# Patient Record
Sex: Male | Born: 1955 | Race: White | Hispanic: No | Marital: Married | Smoking: Former smoker
Health system: Southern US, Community
[De-identification: ages and names within clinical notes are randomized; demographics above are authoritative.]

## PROBLEM LIST (undated history)

## (undated) DIAGNOSIS — I10 Essential (primary) hypertension: Secondary | ICD-10-CM

## (undated) DIAGNOSIS — F419 Anxiety disorder, unspecified: Secondary | ICD-10-CM

## (undated) DIAGNOSIS — K760 Fatty (change of) liver, not elsewhere classified: Secondary | ICD-10-CM

## (undated) DIAGNOSIS — K8020 Calculus of gallbladder without cholecystitis without obstruction: Secondary | ICD-10-CM

## (undated) HISTORY — PX: CYSTECTOMY: SUR359

## (undated) HISTORY — PX: CYST EXCISION: SHX5701

## (undated) HISTORY — DX: Essential (primary) hypertension: I10

## (undated) HISTORY — PX: TONSILLECTOMY: SUR1361

## (undated) HISTORY — PX: VASECTOMY: SHX75

## (undated) HISTORY — DX: Anxiety disorder, unspecified: F41.9

---

## 2005-08-11 DIAGNOSIS — Z0000 Encounter for general adult medical examination without abnormal findings: Secondary | ICD-10-CM | POA: Insufficient documentation

## 2006-10-02 DIAGNOSIS — M79609 Pain in unspecified limb: Secondary | ICD-10-CM | POA: Insufficient documentation

## 2006-10-02 DIAGNOSIS — R609 Edema, unspecified: Secondary | ICD-10-CM | POA: Insufficient documentation

## 2006-10-02 DIAGNOSIS — R030 Elevated blood-pressure reading, without diagnosis of hypertension: Secondary | ICD-10-CM | POA: Insufficient documentation

## 2007-06-13 DIAGNOSIS — M6740 Ganglion, unspecified site: Secondary | ICD-10-CM | POA: Insufficient documentation

## 2007-07-05 ENCOUNTER — Ambulatory Visit: Payer: Self-pay | Admitting: Orthopedic Surgery

## 2008-03-17 DIAGNOSIS — F430 Acute stress reaction: Secondary | ICD-10-CM | POA: Insufficient documentation

## 2008-03-17 DIAGNOSIS — M25519 Pain in unspecified shoulder: Secondary | ICD-10-CM | POA: Insufficient documentation

## 2009-01-14 DIAGNOSIS — R319 Hematuria, unspecified: Secondary | ICD-10-CM | POA: Insufficient documentation

## 2009-01-14 DIAGNOSIS — R61 Generalized hyperhidrosis: Secondary | ICD-10-CM | POA: Insufficient documentation

## 2009-01-15 DIAGNOSIS — R7989 Other specified abnormal findings of blood chemistry: Secondary | ICD-10-CM | POA: Insufficient documentation

## 2009-12-01 LAB — HEPATIC FUNCTION PANEL
ALT: 47 U/L — AB (ref 10–40)
AST: 24 U/L (ref 14–40)
Alkaline Phosphatase: 65 U/L (ref 25–125)
BILIRUBIN, TOTAL: 0.4 mg/dL

## 2009-12-01 LAB — CBC AND DIFFERENTIAL
HCT: 44 % (ref 41–53)
Hemoglobin: 14.7 g/dL (ref 13.5–17.5)
PLATELETS: 205 10*3/uL (ref 150–399)
WBC: 7.5 10*3/mL

## 2009-12-02 ENCOUNTER — Ambulatory Visit: Payer: Self-pay

## 2010-03-19 ENCOUNTER — Ambulatory Visit: Payer: Self-pay | Admitting: Unknown Physician Specialty

## 2010-03-19 LAB — HM COLONOSCOPY

## 2012-08-06 LAB — BASIC METABOLIC PANEL
BUN: 21 mg/dL (ref 4–21)
CREATININE: 0.9 mg/dL (ref 0.6–1.3)
GLUCOSE: 90 mg/dL
POTASSIUM: 4.7 mmol/L (ref 3.4–5.3)
SODIUM: 142 mmol/L (ref 137–147)

## 2012-08-06 LAB — LIPID PANEL
Cholesterol: 109 mg/dL (ref 0–200)
HDL: 36 mg/dL (ref 35–70)
LDL CALC: 57 mg/dL
TRIGLYCERIDES: 82 mg/dL (ref 40–160)

## 2012-08-06 LAB — PSA: PSA: 0.4

## 2014-02-17 ENCOUNTER — Ambulatory Visit: Payer: Self-pay | Admitting: Family Medicine

## 2015-03-31 DIAGNOSIS — K760 Fatty (change of) liver, not elsewhere classified: Secondary | ICD-10-CM | POA: Insufficient documentation

## 2015-03-31 DIAGNOSIS — M79673 Pain in unspecified foot: Secondary | ICD-10-CM | POA: Insufficient documentation

## 2015-03-31 DIAGNOSIS — K824 Cholesterolosis of gallbladder: Secondary | ICD-10-CM | POA: Insufficient documentation

## 2015-04-02 ENCOUNTER — Encounter: Payer: Self-pay | Admitting: Family Medicine

## 2015-04-02 ENCOUNTER — Ambulatory Visit (INDEPENDENT_AMBULATORY_CARE_PROVIDER_SITE_OTHER): Payer: Managed Care, Other (non HMO) | Admitting: Family Medicine

## 2015-04-02 VITALS — BP 120/78 | HR 62 | Temp 97.6°F | Resp 16 | Ht 72.0 in | Wt 239.6 lb

## 2015-04-02 DIAGNOSIS — Z0000 Encounter for general adult medical examination without abnormal findings: Secondary | ICD-10-CM

## 2015-04-02 DIAGNOSIS — Z23 Encounter for immunization: Secondary | ICD-10-CM | POA: Diagnosis not present

## 2015-04-02 DIAGNOSIS — M7652 Patellar tendinitis, left knee: Secondary | ICD-10-CM

## 2015-04-02 NOTE — Patient Instructions (Addendum)
We will call you with the lab results. Remember to exercise at least 30 minutes daily. Try two Aleve twice daily with food for your knee. Consider icing for 20 minutes after work.

## 2015-04-02 NOTE — Progress Notes (Addendum)
Subjective:     Patient ID: Brandon Montes, male   DOB: 1955-03-17, 60 y.o.   MRN: [id number]  HPI  Chief Complaint  Patient presents with  . Annual Exam    Patient comes in office today for his annual physical, patient wanted to address swelling of both his ankles. Patient reports that he notices swelling in the evening after work, patient reports that this has been an issue for many years. Patients last reported Tdap vaccine was 08/11/2005 and colonoscopy 03/19/2010.   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 Reports he is on his feet walking daily but no other sustained exercise. Reports his ankles are mildly swollen at days end but resolve overnight.   Review of Systems General: Feeling well HEENT: regular dental visits. Wears glasses for distance vision but no recent eye exam. Cardiovascular: no chest pain, shortness of breath, or palpitations GI: no heartburn, no change in bowel habits or blood in the stool GU: nocturia x 0- 1, no change in bladder habits  Psychiatric: not depressed Musculoskeletal: recent left knee pain. Reports going up and down stairs a lot recently.    Objective:   Physical Exam  Constitutional: He appears well-developed and well-nourished. No distress.  Eyes: PERRLA, EOMI Neck: no thyromegaly, tenderness or nodules, no carotid bruits ENT: TM's obscured by cerumen; No tonsillar enlargement or exudate, Lungs: Clear Heart : RRR without murmur or gallop Abd: bowel sounds present, soft, non-tender, no organomegaly Rectal: Prostate firm and non-tender Extremities: no edema. Mild left patellar tendon tenderness. Left knee ligaments stable with FROM. Skin: no atypical lesions noted on his back.     Assessment:    1. Annual physical exam - Lipid panel - Comprehensive metabolic panel - PSA  2. Need for tetanus booster - Td vaccine greater than or equal to 7yo preservative free IM  3. Patellar tendonitis of left knee    Plan:   Discussed treatment of right knee. Further f/u pending lab report.

## 2015-04-03 ENCOUNTER — Other Ambulatory Visit: Payer: Self-pay | Admitting: Family Medicine

## 2015-04-03 DIAGNOSIS — K824 Cholesterolosis of gallbladder: Secondary | ICD-10-CM

## 2015-04-03 LAB — COMPREHENSIVE METABOLIC PANEL
ALT: 52 IU/L — AB (ref 0–44)
AST: 35 IU/L (ref 0–40)
Albumin/Globulin Ratio: 1.8 (ref 1.1–2.5)
Albumin: 4.4 g/dL (ref 3.5–5.5)
Alkaline Phosphatase: 68 IU/L (ref 39–117)
BUN / CREAT RATIO: 23 — AB (ref 9–20)
BUN: 21 mg/dL (ref 6–24)
Bilirubin Total: 0.9 mg/dL (ref 0.0–1.2)
CALCIUM: 9.1 mg/dL (ref 8.7–10.2)
CHLORIDE: 101 mmol/L (ref 96–106)
CO2: 24 mmol/L (ref 18–29)
Creatinine, Ser: 0.91 mg/dL (ref 0.76–1.27)
GFR calc non Af Amer: 93 mL/min/{1.73_m2} (ref >59)
GFR, EST AFRICAN AMERICAN: 108 mL/min/{1.73_m2} (ref >59)
Globulin, Total: 2.5 g/dL (ref 1.5–4.5)
Glucose: 96 mg/dL (ref 65–99)
Potassium: 5 mmol/L (ref 3.5–5.2)
SODIUM: 141 mmol/L (ref 134–144)
TOTAL PROTEIN: 6.9 g/dL (ref 6.0–8.5)

## 2015-04-03 LAB — LIPID PANEL
CHOL/HDL RATIO: 3.1 ratio (ref 0.0–5.0)
Cholesterol, Total: 122 mg/dL (ref 100–199)
HDL: 40 mg/dL (ref >39)
LDL CALC: 61 mg/dL (ref 0–99)
Triglycerides: 103 mg/dL (ref 0–149)
VLDL Cholesterol Cal: 21 mg/dL (ref 5–40)

## 2015-04-03 LAB — PSA: PROSTATE SPECIFIC AG, SERUM: 0.4 ng/mL (ref 0.0–4.0)

## 2015-04-03 NOTE — Addendum Note (Signed)
Addended by: Quay Burow on: 04/03/2015 11:26 AM   Modules accepted: Miquel Dunn

## 2015-04-07 ENCOUNTER — Ambulatory Visit
Admission: RE | Admit: 2015-04-07 | Discharge: 2015-04-07 | Disposition: A | Discharge status: Home / Self Care | Payer: Managed Care, Other (non HMO) | Source: Ambulatory Visit | Attending: Family Medicine | Admitting: Family Medicine

## 2015-04-07 DIAGNOSIS — K8020 Calculus of gallbladder without cholecystitis without obstruction: Secondary | ICD-10-CM | POA: Insufficient documentation

## 2015-04-07 DIAGNOSIS — K760 Fatty (change of) liver, not elsewhere classified: Secondary | ICD-10-CM | POA: Diagnosis not present

## 2015-04-07 DIAGNOSIS — K824 Cholesterolosis of gallbladder: Secondary | ICD-10-CM | POA: Diagnosis present

## 2015-04-14 ENCOUNTER — Encounter: Payer: Self-pay | Admitting: Emergency Medicine

## 2015-04-14 ENCOUNTER — Ambulatory Visit (INDEPENDENT_AMBULATORY_CARE_PROVIDER_SITE_OTHER): Payer: Managed Care, Other (non HMO) | Admitting: Family Medicine

## 2015-04-14 ENCOUNTER — Emergency Department: Payer: Managed Care, Other (non HMO)

## 2015-04-14 ENCOUNTER — Encounter: Payer: Self-pay | Admitting: Family Medicine

## 2015-04-14 ENCOUNTER — Emergency Department
Admission: EM | Admit: 2015-04-14 | Discharge: 2015-04-14 | Disposition: A | Discharge status: Home / Self Care | Payer: Managed Care, Other (non HMO) | Attending: Student | Admitting: Student

## 2015-04-14 VITALS — BP 124/80 | HR 74 | Temp 97.5°F | Resp 16 | Wt 237.2 lb

## 2015-04-14 DIAGNOSIS — R000 Tachycardia, unspecified: Secondary | ICD-10-CM | POA: Insufficient documentation

## 2015-04-14 DIAGNOSIS — R1011 Right upper quadrant pain: Secondary | ICD-10-CM

## 2015-04-14 DIAGNOSIS — R1033 Periumbilical pain: Secondary | ICD-10-CM | POA: Diagnosis present

## 2015-04-14 DIAGNOSIS — K5000 Crohn's disease of small intestine without complications: Secondary | ICD-10-CM

## 2015-04-14 DIAGNOSIS — K8020 Calculus of gallbladder without cholecystitis without obstruction: Secondary | ICD-10-CM

## 2015-04-14 HISTORY — DX: Calculus of gallbladder without cholecystitis without obstruction: K80.20

## 2015-04-14 LAB — URINALYSIS COMPLETE WITH MICROSCOPIC (ARMC ONLY)
BACTERIA UA: NONE SEEN
BILIRUBIN URINE: NEGATIVE
GLUCOSE, UA: NEGATIVE mg/dL
Hgb urine dipstick: NEGATIVE
LEUKOCYTES UA: NEGATIVE
NITRITE: NEGATIVE
PH: 5 (ref 5.0–8.0)
Protein, ur: 100 mg/dL — AB
RBC / HPF: NONE SEEN RBC/hpf (ref 0–5)
Specific Gravity, Urine: 1.029 (ref 1.005–1.030)

## 2015-04-14 LAB — COMPREHENSIVE METABOLIC PANEL
ALBUMIN: 5.1 g/dL — AB (ref 3.5–5.0)
ALK PHOS: 85 U/L (ref 38–126)
ALT: 59 U/L (ref 17–63)
AST: 40 U/L (ref 15–41)
Anion gap: 10 (ref 5–15)
BILIRUBIN TOTAL: 1.2 mg/dL (ref 0.3–1.2)
BUN: 31 mg/dL — AB (ref 6–20)
CALCIUM: 10 mg/dL (ref 8.9–10.3)
CO2: 28 mmol/L (ref 22–32)
CREATININE: 1.48 mg/dL — AB (ref 0.61–1.24)
Chloride: 98 mmol/L — ABNORMAL LOW (ref 101–111)
GFR calc Af Amer: 59 mL/min — ABNORMAL LOW (ref >60)
GFR, EST NON AFRICAN AMERICAN: 51 mL/min — AB (ref >60)
GLUCOSE: 199 mg/dL — AB (ref 65–99)
Potassium: 4.7 mmol/L (ref 3.5–5.1)
Sodium: 136 mmol/L (ref 135–145)
TOTAL PROTEIN: 9.2 g/dL — AB (ref 6.5–8.1)

## 2015-04-14 LAB — CBC
HCT: 56.3 % — ABNORMAL HIGH (ref 40.0–52.0)
Hemoglobin: 18.3 g/dL — ABNORMAL HIGH (ref 13.0–18.0)
MCH: 27.3 pg (ref 26.0–34.0)
MCHC: 32.6 g/dL (ref 32.0–36.0)
MCV: 83.7 fL (ref 80.0–100.0)
PLATELETS: 250 10*3/uL (ref 150–440)
RBC: 6.72 MIL/uL — ABNORMAL HIGH (ref 4.40–5.90)
RDW: 14.3 % (ref 11.5–14.5)
WBC: 17.5 10*3/uL — AB (ref 3.8–10.6)

## 2015-04-14 LAB — LIPASE, BLOOD: Lipase: 21 U/L (ref 11–51)

## 2015-04-14 MED ORDER — SODIUM CHLORIDE 0.9 % IV BOLUS (SEPSIS)
1000.0000 mL | Freq: Once | INTRAVENOUS | Status: AC
  Administered 2015-04-14: 1000 mL via INTRAVENOUS

## 2015-04-14 MED ORDER — HYDROCODONE-ACETAMINOPHEN 5-325 MG PO TABS: ORAL_TABLET | ORAL | Status: DC

## 2015-04-14 MED ORDER — ONDANSETRON HCL 4 MG/2ML IJ SOLN
4.0000 mg | Freq: Once | INTRAMUSCULAR | Status: AC
  Administered 2015-04-14: 4 mg via INTRAVENOUS
  Filled 2015-04-14: qty 2

## 2015-04-14 MED ORDER — ONDANSETRON 4 MG PO TBDP: 4.0000 mg | ORAL_TABLET | Freq: Three times a day (TID) | ORAL | Status: DC | PRN

## 2015-04-14 MED ORDER — IOHEXOL 300 MG/ML  SOLN
75.0000 mL | Freq: Once | INTRAMUSCULAR | Status: AC | PRN
  Administered 2015-04-14: 75 mL via INTRAVENOUS

## 2015-04-14 MED ORDER — IOHEXOL 240 MG/ML SOLN
25.0000 mL | Freq: Once | INTRAMUSCULAR | Status: AC | PRN
  Administered 2015-04-14: 25 mL via ORAL

## 2015-04-14 MED ORDER — METRONIDAZOLE 500 MG PO TABS: 500.0000 mg | ORAL_TABLET | Freq: Three times a day (TID) | ORAL | Status: AC

## 2015-04-14 MED ORDER — CIPROFLOXACIN HCL 500 MG PO TABS: 500.0000 mg | ORAL_TABLET | Freq: Two times a day (BID) | ORAL | Status: AC

## 2015-04-14 MED ORDER — CIPROFLOXACIN HCL 500 MG PO TABS
500.0000 mg | ORAL_TABLET | Freq: Once | ORAL | Status: AC
  Administered 2015-04-14: 500 mg via ORAL
  Filled 2015-04-14: qty 1

## 2015-04-14 MED ORDER — METRONIDAZOLE 500 MG PO TABS
500.0000 mg | ORAL_TABLET | Freq: Once | ORAL | Status: AC
Start: 2015-04-14 — End: 2015-04-14
  Administered 2015-04-14: 500 mg via ORAL
  Filled 2015-04-14: qty 1

## 2015-04-14 NOTE — ED Notes (Signed)
Pt reports diffuse abdominal pain that started this morning around 0300; reports 4 episodes of nausea and a normal BM this morning followed by some diarrhea. Pt reports he was at [facility] this morning, pt with known hx of gallstone and told to come here if pain gets worse.

## 2015-04-14 NOTE — Progress Notes (Signed)
Subjective:     Patient ID: Brandon Montes, male   DOB: [birth year], 60 y.o.   MRN: [id number]  HPI  Chief Complaint  Patient presents with  . Abdominal Pain    Patient comes into office today with concerns of abdominal pain (mid abdominal area) since this morning. Patient describes pain as sharp an persistant, patient denies any G.I upset associated.  States it woke him up at 3 AM. He also reports nausea and retching.Reports normal bowel movement this AM. Hx of solitary gallstone and two gallbladder polyps. Normal colonoscopy in 2014. Accompanied by his wife today.   Review of Systems  Constitutional: Negative for fever and chills.       Objective:   Physical Exam  Constitutional: He appears well-developed and well-nourished. No distress.  Abdominal: Soft. There is tenderness (right upper quadrant with Murphy's sign and mild epigastric tenderness without guarding).  Diminished bowel sounds       Assessment:    1. Right upper quadrant pain - HYDROcodone-acetaminophen (NORCO/VICODIN) 5-325 MG tablet; One every 4-6 hours as needed for pain  Dispense: 14 tablet; Refill: 0  2. Calculus of gallbladder without cholecystitis without obstruction    Plan:    Monitor for development of G.I.sx suggestive of infection or recurrent pain suggestive of g.b.stone/ cholecystitis

## 2015-04-14 NOTE — ED Provider Notes (Signed)
[facility] Emergency Department Provider Note  ____________________________________________  Time seen: Approximately 8:48 PM  I have reviewed the triage vital signs and the nursing notes.   HISTORY  Chief Complaint Abdominal Pain    HPI Brandon Montes is a 60 y.o. male with recently diagnosed gallstones who presents with several hours of severe periumbilical pain today, gradual onset, initially severe and now resolved, no modifying factors. The patient reports that at approximately 3 AM he developed this periumbilical abdominal pain as well as several episodes of nonbloody nonbilious emesis and some diarrhea. His symptoms improved throughout the day however they returned in the afternoon. He was seen by his primary care doctor who was concerned that it might have been his gallstone that was causing the issue. Of note, the patient had an ultrasound performed last week for evaluation of left-sided abdominal pain and a single gallstone was found though clear cause of his left-sided/flank pain was not identified. His left side pain has resolved. He has never had any pain like this prior to today. No fevers or chills. No chest pain or difficulty breathing.   Past Medical History  Diagnosis Date  . Gall stones     Patient Active Problem List   Diagnosis Date Noted  . Calculus of gallbladder without cholecystitis without obstruction 04/14/2015  . 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 Gallbladder polyp 03/31/2015  . Abnormal LFTs 01/15/2009    Past Surgical History  Procedure Laterality Date  . Cyst excision      from right wrist    Current Outpatient Rx  Name  Route  Sig  Dispense  Refill  . ibuprofen (ADVIL,MOTRIN) 200 MG tablet   Oral   Take 200 mg by mouth every 6 (six) hours as needed.         Marland Kitchen HYDROcodone-acetaminophen (NORCO/VICODIN) 5-325 MG tablet      One every 4-6 hours as needed for pain Patient not taking: Reported on 04/14/2015  14 tablet   0     Allergies Review of patient's allergies indicates no known allergies.  Family History  Problem Relation Age of Onset  . Diabetes Father   . Diabetes Paternal Grandfather     Social History Social History  Substance Use Topics  . Smoking status: Never Smoker   . Smokeless tobacco: None  . Alcohol Use: None    Review of Systems Constitutional: No fever/chills Eyes: No visual changes. ENT: No sore throat. Cardiovascular: Denies chest pain. Respiratory: Denies shortness of breath. Gastrointestinal: + abdominal pain.  + nausea, + vomiting.  + diarrhea.  No constipation. Genitourinary: Negative for dysuria. Musculoskeletal: Negative for back pain. Skin: Negative for rash. Neurological: Negative for headaches, focal weakness or numbness.  10-point ROS otherwise negative.  ____________________________________________   PHYSICAL EXAM:  VITAL SIGNS: ED Triage Vitals  Enc Vitals Group     BP 04/14/15 1602 128/85 mmHg     Pulse Rate 04/14/15 1602 117     Resp 04/14/15 1602 20     Temp 04/14/15 1602 98 F (36.7 C)     Temp Source 04/14/15 1602 Oral     SpO2 04/14/15 1602 98 %     Weight 04/14/15 1602 238 lb (107.956 kg)     Height 04/14/15 1602 6' (1.829 m)     Head Cir --      Peak Flow --      Pain Score 04/14/15 1607 1     Pain Loc --  Pain Edu? --      Excl. in [place]? --     Constitutional: Alert and oriented. Well appearing and in no acute distress. Eyes: Conjunctivae are normal. PERRL. EOMI. Head: Atraumatic. Nose: No congestion/rhinnorhea. Mouth/Throat: Mucous membranes are moist.  Oropharynx non-erythematous. Neck: No stridor. Cardiovascular: Mildly tachycardic rate, regular rhythm. Grossly normal heart sounds.  Good peripheral circulation. Respiratory: Normal respiratory effort.  No retractions. Lungs CTAB. Gastrointestinal: Normal bowel sounds. Soft with tenderness to palpation throughout the periumbilical region, right upper  quadrant in the right lower quadrant, no rebound or guarding. No CVA tenderness. Genitourinary: Deferred Musculoskeletal: No lower extremity tenderness nor edema.  No joint effusions. Neurologic:  Normal speech and language. No gross focal neurologic deficits are appreciated. No gait instability. Skin:  Skin is warm, dry and intact. No rash noted. Psychiatric: Mood and affect are normal. Speech and behavior are normal.  ____________________________________________   LABS (all labs ordered are listed, but only abnormal results are displayed)  Labs Reviewed  COMPREHENSIVE METABOLIC PANEL - Abnormal; Notable for the following:    Chloride 98 (*)    Glucose, Bld 199 (*)    BUN 31 (*)    Creatinine, Ser 1.48 (*)    Total Protein 9.2 (*)    Albumin 5.1 (*)    GFR calc non Af Amer 51 (*)    GFR calc Af Amer 59 (*)    All other components within normal limits  CBC - Abnormal; Notable for the following:    WBC 17.5 (*)    RBC 6.72 (*)    Hemoglobin 18.3 (*)    HCT 56.3 (*)    All other components within normal limits  URINALYSIS COMPLETEWITH MICROSCOPIC ([facility] ONLY) - Abnormal; Notable for the following:    Color, Urine AMBER (*)    APPearance HAZY (*)    Ketones, ur 1+ (*)    Protein, ur 100 (*)    Squamous Epithelial / LPF 0-5 (*)    All other components within normal limits  LIPASE, BLOOD   ____________________________________________  EKG  ED ECG REPORT I, Joanne Gavel, the attending physician, personally viewed and interpreted this ECG.   Date: 04/14/2015  EKG Time: 22:34   Rate: 100  Rhythm: normal EKG, normal sinus rhythm  Axis: normal  Intervals:none  ST&T Change: No acute ST elevation ____________________________________________  RADIOLOGY   US gallbladder IMPRESSION: Cholelithiasis.  Gallbladder polyps. The largest is 6 mm.  Diffuse hepatic steatosis  No change compared with 1 week ago.  CT abdomen and pelvis IMPRESSION: Small bowel obstruction  with transition zone in the right lower quadrant anteriorly. Possible wall thickening in the terminal ileum may indicate inflammatory bowel disease. Small amount of free fluid in the abdomen and pelvis is likely reactive. Diffuse fatty infiltration of the liver. Cholelithiasis. ____________________________________________   PROCEDURES  Procedure(s) performed: None  Critical Care performed: No  ____________________________________________   INITIAL IMPRESSION / ASSESSMENT AND PLAN / ED COURSE  Pertinent labs & imaging results that were available during my care of the patient were reviewed by me and considered in my medical decision making (see chart for details).  DONNY HUTLEY is a 60 y.o. male with recently diagnosed gallstones who presents with several hours of severe periumbilical pain today, gradual onset, initially severe and now resolved, no modifying factors. On exam, he is mildly tachycardic but in no acute distress. The remainder of his vital signs are stable, he is afebrile. He does have tenderness to palpation throughout  the right abdomen, no rebound, no guarding. White blood cell count is elevated at 18,000. Ultrasound of the gallbladder stones cholelithiasis without cholecystitis. We'll obtain CT of the abdomen and pelvis to rule out appendicitis given his right lower quadrant tenderness.  ----------------------------------------- 11:05 PM on 04/14/2015 ----------------------------------------- CT scan concerning for small bowel obstruction on the radiology read however I discussed this with Dr. Azalee Course of general surgery, she has reviewed the patient's imaging and does not see evidence of transition point. Additionally, he is no longer vomiting and has not required anything for pain and clinically does not appear consistent with a small bowel obstruction at this time. Dr. Azalee Course reports findings more consistent with terminal ileitis. I discussed this with Dr. Gustavo Lah of GI.  As the patient appears well and wishes to go home, and as this may be reactive/viral inflammation, Dr. Gustavo Lah recommends treatment with Flagyl and ciprofloxacin and Gustavo Lah will help to arrange expedient follow-up in clinic tomorrow. I discussed this with the patient. I even offered admission however he reports that he would like to go home and feels much better. We discussed meticulous return precautions and he and his wife at bedside are comfortable with the discharge plan. DC home.  ____________________________________________   FINAL CLINICAL IMPRESSION(S) / ED DIAGNOSES  Final diagnoses:  Calculus of gallbladder without cholecystitis without obstruction  Terminal ileitis without complication ([place])      Joanne Gavel, MD 04/14/15 2333

## 2015-04-14 NOTE — Patient Instructions (Signed)
Monitor for development of diarrhea and resolution of nausea. If pain not getting better or recurs after a meal call for surgical referral.

## 2015-04-14 NOTE — ED Notes (Signed)
Dr. Edd Fabian notified of CT results.

## 2015-04-14 NOTE — ED Notes (Signed)
Pt discharged to home.  Family member driving.  Discharge instructions reviewed.  Verbalized understanding.  No questions or concerns at this time.  Teach back verified.  Pt in NAD.  No items left in ED.   

## 2015-04-21 ENCOUNTER — Telehealth: Payer: Self-pay | Admitting: Family Medicine

## 2015-04-21 NOTE — Telephone Encounter (Signed)
Pt was in the ER last week for GI problems.  Followed up with [facility].  He is still running a fever, and pains still has diarrhea but stopped the vomiting.  Wife called [facility] yesterday and today and has not gotten a call back from them on what to do.  He has a colonoscopy and biopsy for a place that showed up on the CT in the ER.  That appt is a month out.  He is on Cipro and flagyl.  They would like to talk with some one to advise as what to do.  Her call back [phone]  Thanks, Con Memos

## 2015-04-22 NOTE — Telephone Encounter (Signed)
As I am not in the office this week would recommend he f/u with one of the other providers if unable to see G.I. It is possible that the antibiotics that he is completing could be contributing to his persistent diarrhea.

## 2015-04-22 NOTE — Telephone Encounter (Signed)
Spoke with wife and advised as below, she states that G.I had called yesterday afternoon to see if they can move colonoscopy with biopsy to a sooner date, I informed her that persistant diarrhea could be due to antibiotic. She was instructed to contact patient and advise as I stated and if symptoms were not improving to see MD in office this week. KW

## 2015-04-23 ENCOUNTER — Other Ambulatory Visit
Admission: RE | Admit: 2015-04-23 | Discharge: 2015-04-23 | Disposition: A | Discharge status: Home / Self Care | Payer: Managed Care, Other (non HMO) | Source: Ambulatory Visit | Attending: Gastroenterology | Admitting: Gastroenterology

## 2015-04-23 DIAGNOSIS — R197 Diarrhea, unspecified: Secondary | ICD-10-CM | POA: Insufficient documentation

## 2015-04-23 LAB — C DIFFICILE QUICK SCREEN W PCR REFLEX
C DIFFICILE (CDIFF) TOXIN: NEGATIVE
C Diff antigen: NEGATIVE
C Diff interpretation: NEGATIVE

## 2015-04-28 ENCOUNTER — Ambulatory Visit: Payer: Managed Care, Other (non HMO) | Admitting: Surgery

## 2015-05-01 ENCOUNTER — Encounter: Payer: Self-pay | Admitting: *Deleted

## 2015-05-04 ENCOUNTER — Ambulatory Visit
Admission: RE | Admit: 2015-05-04 | Discharge: 2015-05-04 | Disposition: A | Discharge status: Home / Self Care | Payer: Managed Care, Other (non HMO) | Source: Ambulatory Visit | Attending: Gastroenterology | Admitting: Gastroenterology

## 2015-05-04 ENCOUNTER — Encounter: Payer: Self-pay | Admitting: *Deleted

## 2015-05-04 ENCOUNTER — Ambulatory Visit: Payer: Managed Care, Other (non HMO) | Admitting: *Deleted

## 2015-05-04 ENCOUNTER — Encounter
Admission: RE | Disposition: A | Discharge status: Home / Self Care | Payer: Self-pay | Source: Ambulatory Visit | Attending: Gastroenterology

## 2015-05-04 DIAGNOSIS — K648 Other hemorrhoids: Secondary | ICD-10-CM | POA: Insufficient documentation

## 2015-05-04 DIAGNOSIS — Z87891 Personal history of nicotine dependence: Secondary | ICD-10-CM | POA: Diagnosis not present

## 2015-05-04 DIAGNOSIS — Z91013 Allergy to seafood: Secondary | ICD-10-CM | POA: Diagnosis not present

## 2015-05-04 DIAGNOSIS — K760 Fatty (change of) liver, not elsewhere classified: Secondary | ICD-10-CM | POA: Insufficient documentation

## 2015-05-04 DIAGNOSIS — K8020 Calculus of gallbladder without cholecystitis without obstruction: Secondary | ICD-10-CM | POA: Insufficient documentation

## 2015-05-04 DIAGNOSIS — Z806 Family history of leukemia: Secondary | ICD-10-CM | POA: Diagnosis not present

## 2015-05-04 DIAGNOSIS — R109 Unspecified abdominal pain: Secondary | ICD-10-CM | POA: Diagnosis present

## 2015-05-04 DIAGNOSIS — K529 Noninfective gastroenteritis and colitis, unspecified: Secondary | ICD-10-CM | POA: Insufficient documentation

## 2015-05-04 DIAGNOSIS — Z79899 Other long term (current) drug therapy: Secondary | ICD-10-CM | POA: Diagnosis not present

## 2015-05-04 HISTORY — DX: Fatty (change of) liver, not elsewhere classified: K76.0

## 2015-05-04 HISTORY — PX: COLONOSCOPY WITH PROPOFOL: SHX5780

## 2015-05-04 LAB — SURGICAL PATHOLOGY

## 2015-05-04 SURGERY — COLONOSCOPY WITH PROPOFOL
Anesthesia: General

## 2015-05-04 MED ORDER — SODIUM CHLORIDE 0.9 % IV SOLN: INTRAVENOUS | Status: DC

## 2015-05-04 MED ORDER — PROPOFOL 10 MG/ML IV BOLUS
INTRAVENOUS | Status: DC | PRN
  Administered 2015-05-04 (×2): 35 mg via INTRAVENOUS

## 2015-05-04 MED ORDER — MIDAZOLAM HCL 2 MG/2ML IJ SOLN
INTRAMUSCULAR | Status: DC | PRN
  Administered 2015-05-04: 1 mg via INTRAVENOUS

## 2015-05-04 MED ORDER — SODIUM CHLORIDE 0.9 % IV SOLN
INTRAVENOUS | Status: DC
  Administered 2015-05-04 (×2): via INTRAVENOUS

## 2015-05-04 MED ORDER — PROPOFOL 500 MG/50ML IV EMUL
INTRAVENOUS | Status: DC | PRN
  Administered 2015-05-04: 100 ug/kg/min via INTRAVENOUS

## 2015-05-04 NOTE — Anesthesia Preprocedure Evaluation (Signed)
Anesthesia Evaluation  Patient identified by MRN, date of birth, ID band Patient awake    Reviewed: Allergy & Precautions, NPO status , Patient's Chart, lab work & pertinent test results  Airway Mallampati: II  TM Distance: >3 FB Neck ROM: Limited    Dental  (+) Teeth Intact   Pulmonary former smoker,    Pulmonary exam normal        Cardiovascular Exercise Tolerance: Good Normal cardiovascular exam     Neuro/Psych    GI/Hepatic   Endo/Other    Renal/GU      Musculoskeletal   Abdominal (+) + obese,  Abdomen: soft.    Peds  Hematology   Anesthesia Other Findings   Reproductive/Obstetrics                             Anesthesia Physical Anesthesia Plan  ASA: II  Anesthesia Plan: General   Post-op Pain Management:    Induction: Intravenous  Airway Management Planned: Nasal Cannula  Additional Equipment:   Intra-op Plan:   Post-operative Plan:   Informed Consent: I have reviewed the patients History and Physical, chart, labs and discussed the procedure including the risks, benefits and alternatives for the proposed anesthesia with the patient or authorized representative who has indicated his/her understanding and acceptance.     Plan Discussed with: CRNA  Anesthesia Plan Comments:         Anesthesia Quick Evaluation

## 2015-05-04 NOTE — H&P (Signed)
Outpatient short stay form Pre-procedure 05/04/2015 12:05 PM Brandon Sails MD  Primary Physician: Vernie Murders, PA  Reason for visit:  Colonoscopy  History of present illness:  Patient is a 60 year old male presenting today for a colonoscopy. He had an episode several weeks ago requiring him to go to the emergency room abdominal pain. CT scan at that time have possible ileitis and some evidence of possible small bowel obstruction. He was treated with antibiotics and is feeling much better than he did. Discomfort in the Right Lower Quadrant Extending toward the Umbilicus. There Is Also a Family History of Inflammatory Bowel Disease with His Daughter.Marland Kitchen  He tolerated his prep well. He takes no aspirin however does take Mobic. He takes no blood thinning agents.    Current facility-administered medications:  .  0.9 %  sodium chloride infusion, , Intravenous, Continuous, Brandon Sails, MD, Last Rate: 20 mL/hr at 05/04/15 1131 .  0.9 %  sodium chloride infusion, , Intravenous, Continuous, Brandon Sails, MD  Prescriptions prior to admission  Medication Sig Dispense Refill Last Dose  . HYDROcodone-acetaminophen (NORCO/VICODIN) 5-325 MG tablet One every 4-6 hours as needed for pain (Patient not taking: Reported on 04/14/2015) 14 tablet 0 Completed Course at Unknown time  . ibuprofen (ADVIL,MOTRIN) 200 MG tablet Take 200 mg by mouth every 6 (six) hours as needed.   PRN at PRN  . ondansetron (ZOFRAN ODT) 4 MG disintegrating tablet Take 1 tablet (4 mg total) by mouth every 8 (eight) hours as needed for nausea or vomiting. 12 tablet 0      Allergies  Allergen Reactions  . Shellfish-Derived Products      Past Medical History  Diagnosis Date  . Gall stones   . Fatty liver     Review of systems:      Physical Exam    Heart and lungs: Regular rate and rhythm without rub or gallop, lungs are bilaterally clear.    HEENT: Normocephalic atraumatic eyes are anicteric    Other:      Pertinant exam for procedure: Soft, mild discomfort palpation the right lower quadrant. No masses or rebound. Bowel sounds positive normoactive.    Planned proceedures: Colonoscopy and indicated procedures. I have discussed the risks benefits and complications of procedures to include not limited to bleeding, infection, perforation and the risk of sedation and the patient wishes to proceed.    Brandon Sails, MD Gastroenterology 05/04/2015  12:05 PM

## 2015-05-04 NOTE — Transfer of Care (Signed)
Immediate Anesthesia Transfer of Care Note  Patient: Brandon Montes  Procedure(s) Performed: Procedure(s): COLONOSCOPY WITH PROPOFOL (N/A)  Patient Location: PACU  Anesthesia Type:General  Level of Consciousness: awake, alert  and oriented  Airway & Oxygen Therapy: Patient Spontanous Breathing and Patient connected to nasal cannula oxygen  Post-op Assessment: Report given to RN and Post -op Vital signs reviewed and stable  Post vital signs: Reviewed and stable  Last Vitals:  Filed Vitals:   05/04/15 1118  BP: 135/83  Pulse: 71  Temp: 35.8 C  Resp: 16    Complications: No apparent anesthesia complications

## 2015-05-04 NOTE — Anesthesia Postprocedure Evaluation (Signed)
Anesthesia Post Note  Patient: Brandon Montes  Procedure(s) Performed: Procedure(s) (LRB): COLONOSCOPY WITH PROPOFOL (N/A)  Patient location during evaluation: Endoscopy Anesthesia Type: General Level of consciousness: awake Pain management: pain level controlled Vital Signs Assessment: post-procedure vital signs reviewed and stable Respiratory status: spontaneous breathing Cardiovascular status: blood pressure returned to baseline Postop Assessment: no headache Anesthetic complications: no    Last Vitals:  Filed Vitals:   05/04/15 1118  BP: 135/83  Pulse: 71  Temp: 35.8 C  Resp: 16    Last Pain: There were no vitals filed for this visit.               Caytlin Better M

## 2015-05-04 NOTE — Op Note (Signed)
[facility] Gastroenterology Patient Name: Brandon Montes Procedure Date: 05/04/2015 12:14 PM MRN: [id number] Account #: [id number] Date of Birth: [birth year] Admit Type: Outpatient Age: 59 Room: [facility] ENDO ROOM 3 Gender: Male Note Status: Finalized Procedure:            Colonoscopy Indications:          Abdominal pain in the right lower quadrant, Abnormal CT                        of the GI tract Providers:            Lollie Sails, MD Referring MD:         Rose Fillers. Jaynie Crumble, MD (Referring MD) Medicines:            Monitored Anesthesia Care Complications:        No immediate complications. Procedure:            Pre-Anesthesia Assessment:                       - ASA Grade Assessment: II - A patient with mild                        systemic disease.                       After obtaining informed consent, the colonoscope was                        passed under direct vision. Throughout the procedure,                        the patient's blood pressure, pulse, and oxygen                        saturations were monitored continuously. The                        Colonoscope was introduced through the anus and                        advanced to the the terminal ileum. The colonoscopy was                        performed without difficulty. The patient tolerated the                        procedure well. The quality of the bowel preparation                        was fair. Findings:      Diffuse moderate inflammation characterized by adherent blood,       congestion (edema) and erythema was found in the rectum, in the sigmoid       colon and in the distal descending colon. Biopsies were taken with a       cold forceps for histology. Biopsies for histology were taken with a       cold forceps from the cecum, ascending colon, transverse colon,       descending colon, sigmoid colon and rectum for evaluation of microscopic       colitis.  The terminal ileum appeared  normal. Biopsies were taken with a cold       forceps for histology.      The digital rectal exam was normal.      The retroflexed view of the distal rectum and anal verge was normal and       showed no anal or rectal abnormalities other than that specified.      Non-bleeding internal hemorrhoids were found during retroflexion. The       hemorrhoids were small. Impression:           - Preparation of the colon was fair.                       - Diffuse moderate inflammation was found in the                        rectum, in the sigmoid colon and in the distal                        descending colon secondary to left-sided colitis.                        Biopsied.                       - The examined portion of the ileum was normal.                        Biopsied.                       - The distal rectum and anal verge are normal on                        retroflexion view. Recommendation:       - Await pathology results.                       - Check [employer] IBD panel today. Procedure Code(s):    --- Professional ---                       [phone], Colonoscopy, flexible; with biopsy, single or                        multiple Diagnosis Code(s):    --- Professional ---                       K51.50, Left sided colitis without complications                       R10.31, Right lower quadrant pain                       R93.3, Abnormal findings on diagnostic imaging of other                        parts of digestive tract CPT copyright 2016 [employer]. All rights reserved. The codes documented in this report are preliminary and upon coder review may  be revised to meet current compliance requirements. Lollie Sails, MD 05/04/2015 1:05:39 PM This report has been signed electronically. Number of Addenda: 0  Note Initiated On: 05/04/2015 12:14 PM Scope Withdrawal Time: 0 hours 16 minutes 17 seconds  Total Procedure Duration: 0 hours 27 minutes 51 seconds       [facility]

## 2015-05-05 ENCOUNTER — Encounter: Payer: Self-pay | Admitting: Gastroenterology

## 2015-05-06 ENCOUNTER — Other Ambulatory Visit: Payer: Self-pay | Admitting: Gastroenterology

## 2015-05-06 DIAGNOSIS — R1084 Generalized abdominal pain: Secondary | ICD-10-CM

## 2015-05-06 DIAGNOSIS — R933 Abnormal findings on diagnostic imaging of other parts of digestive tract: Secondary | ICD-10-CM

## 2015-05-06 LAB — INFLAMMATORY BOWEL DISEASE-IBD
Atypical P-ANCA titer: <1:20 {titer}
SACCHAROMYCES CEREVISIAE AB: 50.6 U — AB (ref 0.0–24.9)
Saccharomyces cerevisiae, IgA: 126.1 Units — ABNORMAL HIGH (ref 0.0–24.9)

## 2015-05-13 ENCOUNTER — Ambulatory Visit
Admission: RE | Admit: 2015-05-13 | Discharge: 2015-05-13 | Disposition: A | Discharge status: Home / Self Care | Payer: Managed Care, Other (non HMO) | Source: Ambulatory Visit | Attending: Gastroenterology | Admitting: Gastroenterology

## 2015-05-13 DIAGNOSIS — R1084 Generalized abdominal pain: Secondary | ICD-10-CM | POA: Diagnosis present

## 2015-05-13 DIAGNOSIS — R933 Abnormal findings on diagnostic imaging of other parts of digestive tract: Secondary | ICD-10-CM

## 2015-08-18 DIAGNOSIS — K529 Noninfective gastroenteritis and colitis, unspecified: Secondary | ICD-10-CM | POA: Diagnosis not present

## 2015-08-18 DIAGNOSIS — K219 Gastro-esophageal reflux disease without esophagitis: Secondary | ICD-10-CM | POA: Diagnosis not present

## 2016-04-08 ENCOUNTER — Encounter: Payer: Self-pay | Admitting: Family Medicine

## 2016-04-08 ENCOUNTER — Ambulatory Visit (INDEPENDENT_AMBULATORY_CARE_PROVIDER_SITE_OTHER): Payer: BLUE CROSS/BLUE SHIELD | Admitting: Family Medicine

## 2016-04-08 VITALS — BP 122/78 | HR 72 | Temp 98.4°F | Resp 16 | Ht 72.0 in | Wt 225.0 lb

## 2016-04-08 DIAGNOSIS — Z1159 Encounter for screening for other viral diseases: Secondary | ICD-10-CM | POA: Diagnosis not present

## 2016-04-08 DIAGNOSIS — K529 Noninfective gastroenteritis and colitis, unspecified: Secondary | ICD-10-CM

## 2016-04-08 DIAGNOSIS — H6123 Impacted cerumen, bilateral: Secondary | ICD-10-CM | POA: Diagnosis not present

## 2016-04-08 DIAGNOSIS — K8020 Calculus of gallbladder without cholecystitis without obstruction: Secondary | ICD-10-CM | POA: Diagnosis not present

## 2016-04-08 DIAGNOSIS — E010 Iodine-deficiency related diffuse (endemic) goiter: Secondary | ICD-10-CM

## 2016-04-08 DIAGNOSIS — K760 Fatty (change of) liver, not elsewhere classified: Secondary | ICD-10-CM | POA: Diagnosis not present

## 2016-04-08 DIAGNOSIS — Z23 Encounter for immunization: Secondary | ICD-10-CM | POA: Diagnosis not present

## 2016-04-08 DIAGNOSIS — Z0000 Encounter for general adult medical examination without abnormal findings: Secondary | ICD-10-CM | POA: Diagnosis not present

## 2016-04-08 NOTE — Progress Notes (Signed)
Subjective:     Patient ID: Brandon Montes, male   DOB: 1955/10/26, 61 y.o.   MRN: [id number]  HPI  Chief Complaint  Patient presents with  . Annual Exam  Reports annual follow up with G.I., Dr. Gustavo Lah, for IBD which is controlled with medication. Form completed for work stating he does not smoke.  Review of Systems General: Feeling well HEENT: regular dental visits; last eye exam two year ago-encouraged to update (wears glasses) Cardiovascular: no chest pain, shortness of breath, or palpitations GI: no heartburn, no change in bowel habits or blood in the stool, denies sx from current gallstone. GU: nocturia x0- 1, no change in bladder habits  Psychiatric: not depressed Musculoskeletal: Reports knee pains from playing softball    Objective:   Physical Exam  Constitutional: He appears well-developed and well-nourished. No distress.  Eyes: PERRLA, EOMI Neck: Non-tender enlarged thyroid, no cervical adenopathy or carotid bruits ENT: Ear canals occluded with cerumen: after irrigation per Laura-TM's intact and patient reports improvement in his hearing. No tonsillar enlargement or exudate. Lungs: Clear Heart : RRR without murmur or gallop Abd: bowel sounds present, soft, non-tender, no organomegaly Rectal: Prostate firm and non-tender Extremities: no edema      Assessment:    1. Annual physical exam - Comprehensive metabolic panel - Lipid panel - PSA  2. Hearing loss of both ears due to cerumen impaction - EAR CERUMEN REMOVAL  3. Fatty metamorphosis of liver - Comprehensive metabolic panel  4. Calculus of gallbladder without cholecystitis without obstruction  5. IBD (inflammatory bowel disease): per G.I.  6. Thyromegaly - T4, free - TSH  7. Need for influenza vaccination - Flu Vaccine QUAD 36+ mos PF IM (Fluarix & Fluzone Quad PF)  8. Encounter for hepatitis C screening test for low risk patient - Hepatitis C Antibody    Plan:    Further f/u pending lab work.  Encouraged exercise 30 minutes daily.

## 2016-04-08 NOTE — Patient Instructions (Signed)
We will call you with the lab results. Encourage regular exercise 30 minutes daily.

## 2016-04-11 DIAGNOSIS — E010 Iodine-deficiency related diffuse (endemic) goiter: Secondary | ICD-10-CM | POA: Diagnosis not present

## 2016-04-11 DIAGNOSIS — Z0000 Encounter for general adult medical examination without abnormal findings: Secondary | ICD-10-CM | POA: Diagnosis not present

## 2016-04-11 DIAGNOSIS — K760 Fatty (change of) liver, not elsewhere classified: Secondary | ICD-10-CM | POA: Diagnosis not present

## 2016-04-12 ENCOUNTER — Telehealth: Payer: Self-pay

## 2016-04-12 LAB — COMPREHENSIVE METABOLIC PANEL
ALK PHOS: 70 IU/L (ref 39–117)
ALT: 18 IU/L (ref 0–44)
AST: 17 IU/L (ref 0–40)
Albumin/Globulin Ratio: 1.6 (ref 1.2–2.2)
Albumin: 4.2 g/dL (ref 3.5–5.5)
BUN / CREAT RATIO: 13 (ref 9–20)
BUN: 12 mg/dL (ref 6–24)
Bilirubin Total: 0.4 mg/dL (ref 0.0–1.2)
CALCIUM: 9 mg/dL (ref 8.7–10.2)
CHLORIDE: 100 mmol/L (ref 96–106)
CO2: 26 mmol/L (ref 18–29)
CREATININE: 0.93 mg/dL (ref 0.76–1.27)
GFR calc Af Amer: 104 mL/min/{1.73_m2} (ref >59)
GFR calc non Af Amer: 90 mL/min/{1.73_m2} (ref >59)
Globulin, Total: 2.6 g/dL (ref 1.5–4.5)
Glucose: 90 mg/dL (ref 65–99)
Potassium: 4.6 mmol/L (ref 3.5–5.2)
Sodium: 139 mmol/L (ref 134–144)
Total Protein: 6.8 g/dL (ref 6.0–8.5)

## 2016-04-12 LAB — T4, FREE: FREE T4: 1.01 ng/dL (ref 0.82–1.77)

## 2016-04-12 LAB — LIPID PANEL
CHOLESTEROL TOTAL: 139 mg/dL (ref 100–199)
Chol/HDL Ratio: 4 ratio units (ref 0.0–5.0)
HDL: 35 mg/dL — AB (ref >39)
LDL CALC: 77 mg/dL (ref 0–99)
TRIGLYCERIDES: 135 mg/dL (ref 0–149)
VLDL CHOLESTEROL CAL: 27 mg/dL (ref 5–40)

## 2016-04-12 LAB — HEPATITIS C ANTIBODY: Hep C Virus Ab: 0.1 s/co ratio (ref 0.0–0.9)

## 2016-04-12 LAB — TSH: TSH: 1.56 u[IU]/mL (ref 0.450–4.500)

## 2016-04-12 LAB — PSA: Prostate Specific Ag, Serum: 0.4 ng/mL (ref 0.0–4.0)

## 2016-04-12 NOTE — Telephone Encounter (Signed)
Patient advised as directed below.  Thanks,  -Paislei Dorval 

## 2016-04-12 NOTE — Telephone Encounter (Signed)
-----   Message from Carmon Ginsberg, [place] sent at 04/12/2016  7:53 AM EDT ----- Labs look good except HDL (good cholesterol) is a little low. This  Improves with regular exercise.

## 2016-07-28 DIAGNOSIS — L0291 Cutaneous abscess, unspecified: Secondary | ICD-10-CM | POA: Diagnosis not present

## 2016-10-14 ENCOUNTER — Telehealth: Payer: Self-pay

## 2016-10-14 NOTE — Telephone Encounter (Signed)
Patients states he has been experiencing indigestion for the past month. Chest pain started 1 week ago, and left shoulder and arm pain started this week. Patient states it is a constant pain that is unchanged since onset. He denies SOB and nausea. Patient does not have a PMH of heart disease. Discussed symptoms with Mikki Santee. Advised patient per Mikki Santee to take Pepcid complete daily and Prevacid 15 mg 2 tablets daily to see if symptoms improve over the next 3 days. Advised patient if symptoms don't improve he needs to come in for a OV, and if symptoms worsen or if he develops any new cardiac symptoms to go to the ER for evaluation. Patient verbalized understanding.

## 2016-11-01 IMAGING — CR DG SMALL BOWEL
2 series · 13 of 13 positions shown · non-contrast
Comparison: None.

CLINICAL DATA: Generalized abdominal pain.

EXAM:
SMALL BOWEL SERIES
TECHNIQUE: Following ingestion of thin barium, serial small bowel images were
obtained including spot views of the terminal ileum.
FLUOROSCOPY TIME:  Radiation Exposure Index (as provided by the
fluoroscopic device): 13.4 mGy

[Series 1: t abdomen supine · 0.14mm/px · 5 of 5 slices shown]
[im 1/5]
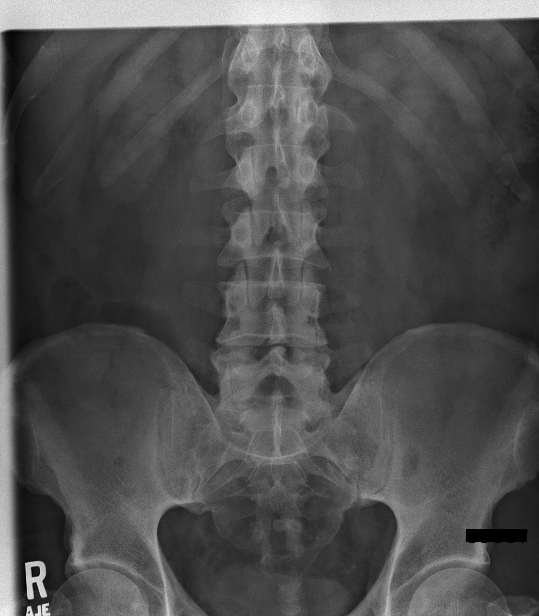
[im 2/5]
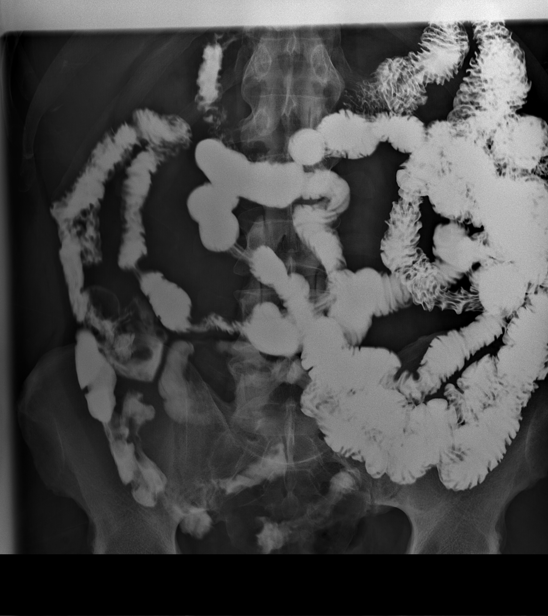
[im 3/5]
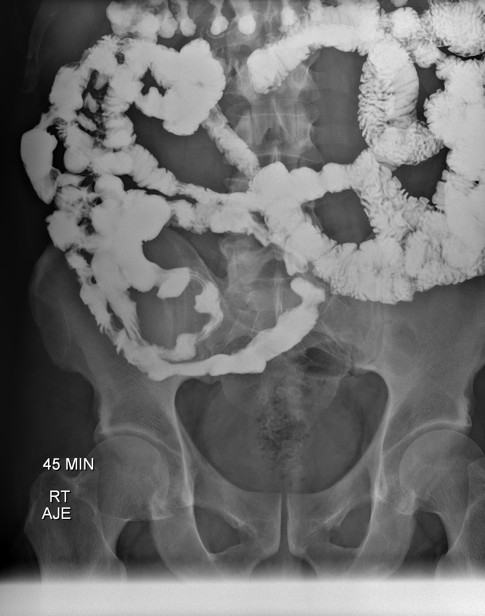
[im 4/5]
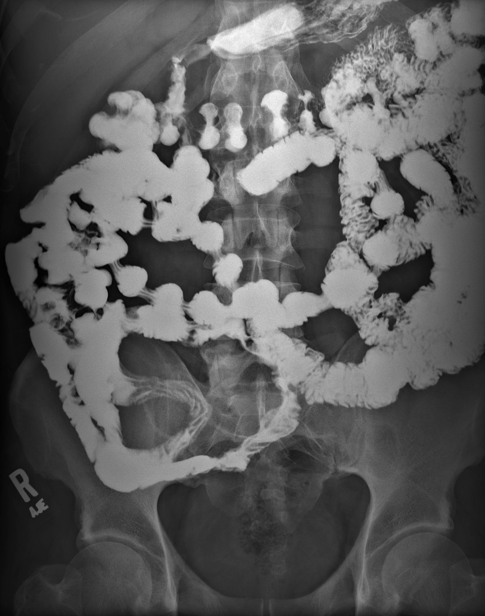
[im 5/5]
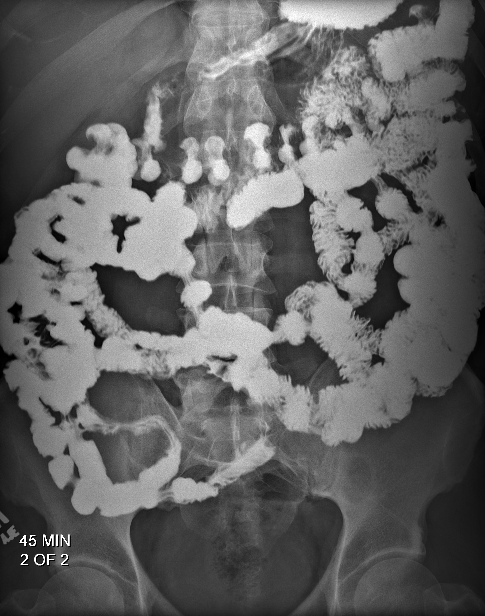

[Series 6: cp_standard · 0.27mm/px · 8 of 8 slices shown]
[im 1/8]
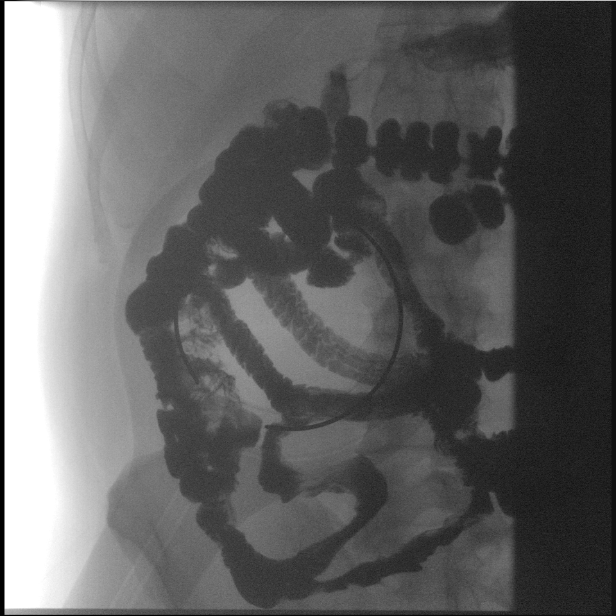
[im 2/8]
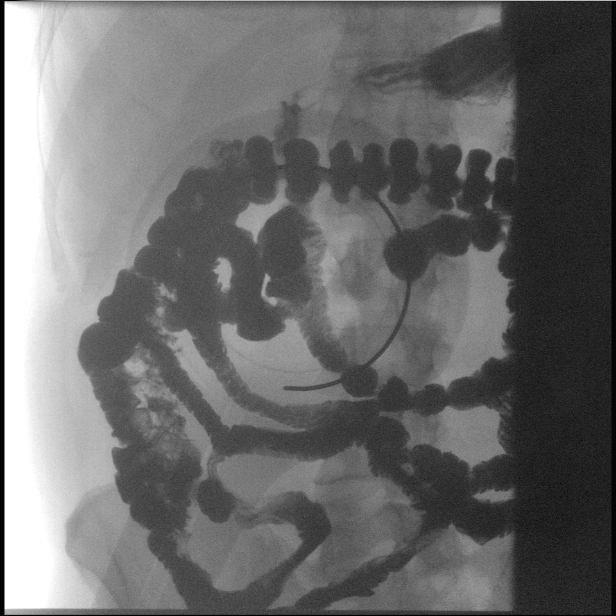
[im 3/8]
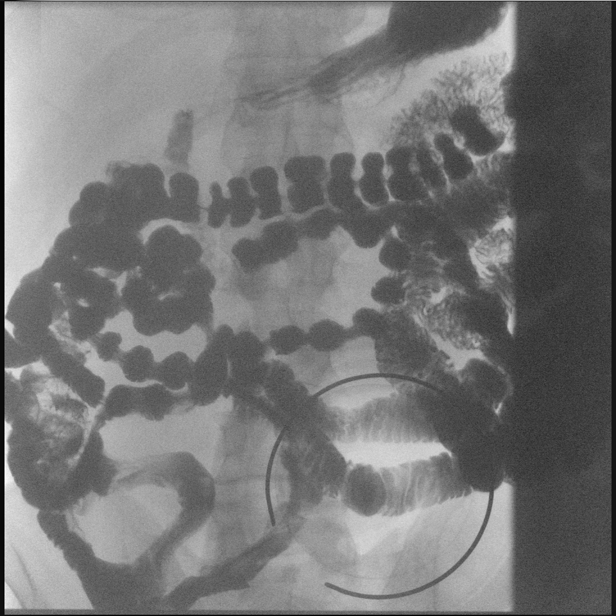
[im 4/8]
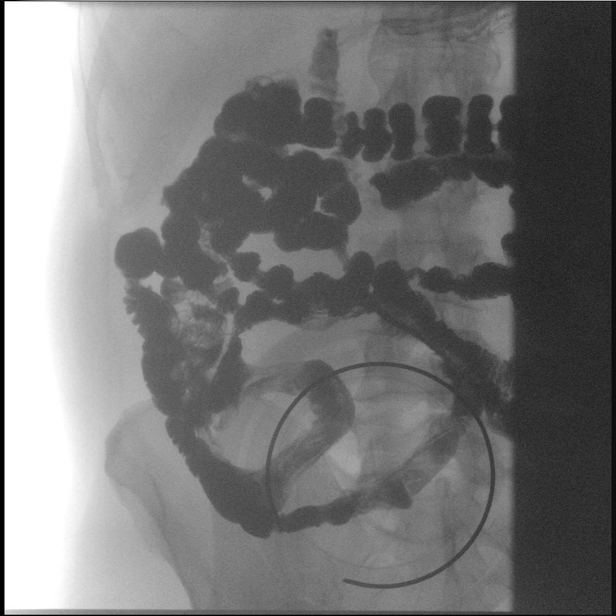
[im 5/8]
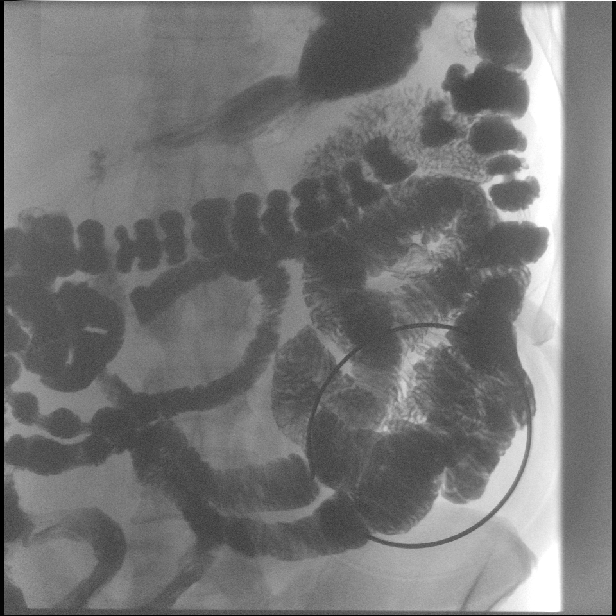
[im 6/8]
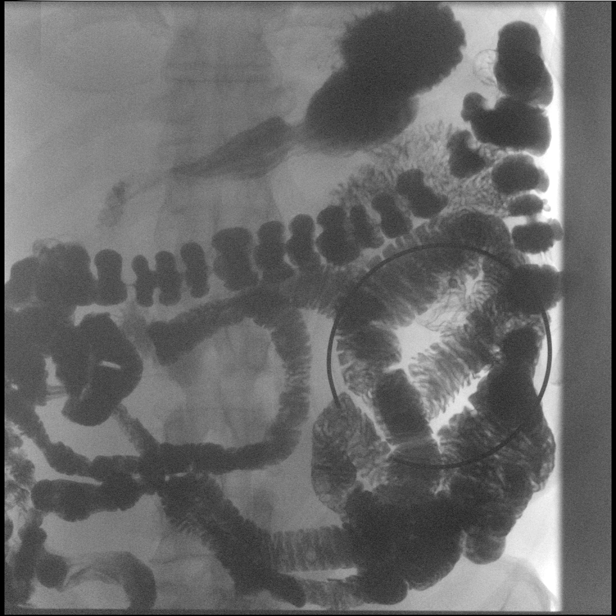
[im 7/8]
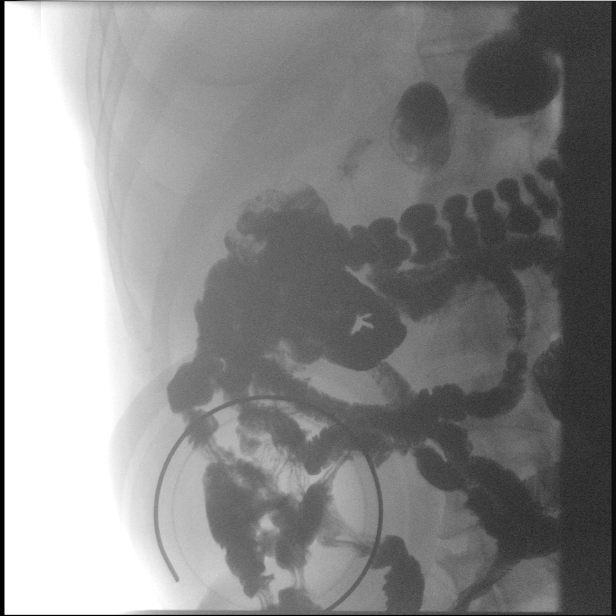
[im 8/8]
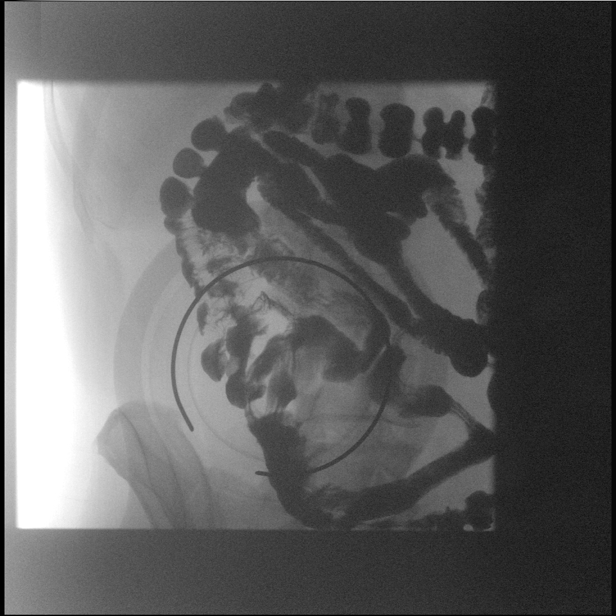

[13 of 13 positions shown; findings below may reference images not displayed]

FINDINGS: Scout frontal abdominal and pelvic radiograph demonstrates no bowel
dilatation to suggest obstruction. There is no evidence of
pneumoperitoneum, portal venous gas or pneumatosis. There are no
pathologic calcifications along the expected course of the ureters.
The osseous structures are unremarkable.

.

Medium density barium was periodically observed under fluoroscopy to
travel from the stomach to the ascending colon (over a 45 minute
time period). There is no evidence of small bowel stricture or
obstruction. No large filling defects to suggest mass lesion. In
addition, there is no evidence of tethering or definite inflammatory
changes present within the small bowel.
IMPRESSION: Normal small bowel follow-through.

## 2017-04-11 ENCOUNTER — Encounter: Payer: BLUE CROSS/BLUE SHIELD | Admitting: Family Medicine

## 2017-05-15 ENCOUNTER — Encounter: Payer: BLUE CROSS/BLUE SHIELD | Admitting: Family Medicine

## 2017-05-22 ENCOUNTER — Encounter: Payer: Self-pay | Admitting: Family Medicine

## 2017-05-22 ENCOUNTER — Ambulatory Visit (INDEPENDENT_AMBULATORY_CARE_PROVIDER_SITE_OTHER): Payer: BLUE CROSS/BLUE SHIELD | Admitting: Family Medicine

## 2017-05-22 VITALS — BP 126/82 | HR 58 | Temp 97.7°F | Resp 14 | Ht 72.0 in | Wt 212.0 lb

## 2017-05-22 DIAGNOSIS — K529 Noninfective gastroenteritis and colitis, unspecified: Secondary | ICD-10-CM | POA: Diagnosis not present

## 2017-05-22 DIAGNOSIS — R1012 Left upper quadrant pain: Secondary | ICD-10-CM | POA: Insufficient documentation

## 2017-05-22 DIAGNOSIS — Z8669 Personal history of other diseases of the nervous system and sense organs: Secondary | ICD-10-CM | POA: Insufficient documentation

## 2017-05-22 DIAGNOSIS — S86819A Strain of other muscle(s) and tendon(s) at lower leg level, unspecified leg, initial encounter: Secondary | ICD-10-CM | POA: Insufficient documentation

## 2017-05-22 DIAGNOSIS — K50119 Crohn's disease of large intestine with unspecified complications: Secondary | ICD-10-CM | POA: Diagnosis not present

## 2017-05-22 DIAGNOSIS — E010 Iodine-deficiency related diffuse (endemic) goiter: Secondary | ICD-10-CM | POA: Diagnosis not present

## 2017-05-22 DIAGNOSIS — Z0000 Encounter for general adult medical examination without abnormal findings: Secondary | ICD-10-CM

## 2017-05-22 DIAGNOSIS — H6120 Impacted cerumen, unspecified ear: Secondary | ICD-10-CM | POA: Insufficient documentation

## 2017-05-22 DIAGNOSIS — H6123 Impacted cerumen, bilateral: Secondary | ICD-10-CM | POA: Diagnosis not present

## 2017-05-22 DIAGNOSIS — M7660 Achilles tendinitis, unspecified leg: Secondary | ICD-10-CM | POA: Insufficient documentation

## 2017-05-22 NOTE — Patient Instructions (Addendum)
We will call you with the lab results and about the thyroid ultrasound. Discussed getting baseline eye exam.

## 2017-05-22 NOTE — Progress Notes (Signed)
  Subjective:     Patient ID: Brandon Montes, male   DOB: [birth year], 62 y.o.   MRN: [id number] Chief Complaint  Patient presents with  . Annual Exam   HPI States he has changed his diet and is eating more healthy. 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 Defers shingles vaccine. Review of Systems General: Feeling well and has lost weight intentionally at the gym. HEENT: regular dental visits.Encouraged to update eye exam. Cardiovascular: no chest pain, shortness of breath, or palpitations GI: no heartburn, no change in bowel habits or blood in the stool. Continues to be followed by G.I.for IBD. GU: nocturia x 0, no change in bladder habits  Psychiatric: not depressed Musculoskeletal: left knee pain intermittently he believes he injured playing softball a year ago. Defers further workup at this time.    Objective:   Physical Exam  Constitutional: He appears well-developed and well-nourished. No distress.  Eyes: PERRLA, EOMI Neck:  Thyromegaly is present but no tenderness or nodules, no cervical adenopathy, or carotid bruits ENT: TM's intact without inflammation; No tonsillar enlargement or exudate, Lungs: Clear Heart : RRR without murmur or gallop Abd: bowel sounds present, soft, non-tender, no organomegaly Rectal: Prostate firm and non-tender Extremities: no edema Skin: no atypical lesions noted on his back.     Assessment:    1. Annual physical exam - PSA - Lipid panel - Comprehensive metabolic panel  2. Thyromegaly - T4, free - TSH - US Soft Tissue Head/Neck; Future  3. Cerumen debris on tympanic membrane of both ears - EAR CERUMEN REMOVAL  4. IBD (inflammatory bowel disease); per G.I.    Plan:    Further f/u pending lab work and ultrasound results.

## 2017-05-23 LAB — COMPREHENSIVE METABOLIC PANEL
A/G RATIO: 1.9 (ref 1.2–2.2)
ALK PHOS: 61 IU/L (ref 39–117)
ALT: 15 IU/L (ref 0–44)
AST: 17 IU/L (ref 0–40)
Albumin: 4.3 g/dL (ref 3.6–4.8)
BUN / CREAT RATIO: 16 (ref 10–24)
BUN: 15 mg/dL (ref 8–27)
Bilirubin Total: 0.7 mg/dL (ref 0.0–1.2)
CO2: 25 mmol/L (ref 20–29)
Calcium: 9.3 mg/dL (ref 8.6–10.2)
Chloride: 104 mmol/L (ref 96–106)
Creatinine, Ser: 0.96 mg/dL (ref 0.76–1.27)
GFR calc Af Amer: 99 mL/min/{1.73_m2} (ref >59)
GFR, EST NON AFRICAN AMERICAN: 86 mL/min/{1.73_m2} (ref >59)
GLOBULIN, TOTAL: 2.3 g/dL (ref 1.5–4.5)
Glucose: 91 mg/dL (ref 65–99)
Potassium: 4.3 mmol/L (ref 3.5–5.2)
SODIUM: 142 mmol/L (ref 134–144)
Total Protein: 6.6 g/dL (ref 6.0–8.5)

## 2017-05-23 LAB — TSH: TSH: 1.39 u[IU]/mL (ref 0.450–4.500)

## 2017-05-23 LAB — LIPID PANEL
CHOLESTEROL TOTAL: 135 mg/dL (ref 100–199)
Chol/HDL Ratio: 3.3 ratio (ref 0.0–5.0)
HDL: 41 mg/dL (ref >39)
LDL CALC: 73 mg/dL (ref 0–99)
TRIGLYCERIDES: 104 mg/dL (ref 0–149)
VLDL CHOLESTEROL CAL: 21 mg/dL (ref 5–40)

## 2017-05-23 LAB — PSA: Prostate Specific Ag, Serum: 0.4 ng/mL (ref 0.0–4.0)

## 2017-05-23 LAB — T4, FREE: FREE T4: 1.1 ng/dL (ref 0.82–1.77)

## 2017-05-26 DIAGNOSIS — K5090 Crohn's disease, unspecified, without complications: Secondary | ICD-10-CM | POA: Diagnosis not present

## 2017-05-26 DIAGNOSIS — K5010 Crohn's disease of large intestine without complications: Secondary | ICD-10-CM | POA: Diagnosis not present

## 2017-05-26 LAB — HM COLONOSCOPY

## 2017-05-29 ENCOUNTER — Ambulatory Visit
Admission: RE | Admit: 2017-05-29 | Discharge: 2017-05-29 | Disposition: A | Discharge status: Home / Self Care | Payer: BLUE CROSS/BLUE SHIELD | Source: Ambulatory Visit | Attending: Family Medicine | Admitting: Family Medicine

## 2017-05-29 DIAGNOSIS — E010 Iodine-deficiency related diffuse (endemic) goiter: Secondary | ICD-10-CM | POA: Insufficient documentation

## 2017-05-29 DIAGNOSIS — E041 Nontoxic single thyroid nodule: Secondary | ICD-10-CM | POA: Diagnosis not present

## 2017-05-30 ENCOUNTER — Other Ambulatory Visit: Payer: Self-pay | Admitting: Family Medicine

## 2017-05-30 DIAGNOSIS — E041 Nontoxic single thyroid nodule: Secondary | ICD-10-CM

## 2017-05-30 NOTE — Progress Notes (Unsigned)
f °

## 2017-06-14 DIAGNOSIS — E041 Nontoxic single thyroid nodule: Secondary | ICD-10-CM | POA: Diagnosis not present

## 2017-06-15 ENCOUNTER — Other Ambulatory Visit: Payer: Self-pay | Admitting: Unknown Physician Specialty

## 2017-06-15 DIAGNOSIS — E041 Nontoxic single thyroid nodule: Secondary | ICD-10-CM

## 2017-06-27 ENCOUNTER — Ambulatory Visit: Payer: Managed Care, Other (non HMO)

## 2017-07-11 ENCOUNTER — Ambulatory Visit
Admission: RE | Admit: 2017-07-11 | Discharge: 2017-07-11 | Disposition: A | Discharge status: Home / Self Care | Payer: BLUE CROSS/BLUE SHIELD | Source: Ambulatory Visit | Attending: Unknown Physician Specialty | Admitting: Unknown Physician Specialty

## 2017-07-11 DIAGNOSIS — E041 Nontoxic single thyroid nodule: Secondary | ICD-10-CM | POA: Insufficient documentation

## 2017-07-11 NOTE — Discharge Instructions (Signed)
Thyroid Biopsy, Care After °Refer to this sheet in the next few weeks. These instructions provide you with information on caring for yourself after your procedure. Your health care provider may also give you more specific instructions. Your treatment has been planned according to current medical practices, but problems sometimes occur. Call your health care provider if you have any problems or questions after your procedure. °What can I expect after the procedure? °After your procedure, it is typical to have the following: °· You may have soreness and tenderness at the biopsy site for a few days. °· You may have a sore throat or a hoarse voice if you had an open biopsy. This should go away after a couple days. ° °Follow these instructions at home: °· Take medicines only as directed by your health care provider. °· To ease discomfort at the biopsy site: °? Keep your head raised on a pillow when you are lying down. °? Support the back of your head and neck with both hands as you sit up from a lying position. °· If you have a sore throat, try using throat lozenges or gargling with warm salt water. °· Keep all follow-up visits as directed by your health care provider. This is important. °Contact a health care provider if: °· You have a fever. °Get help right away if: °· You have severe bleeding from the biopsy site. °· You have difficulty swallowing. °· You have drainage, redness, swelling, or pain at the biopsy site. °· You have swollen glands (lymph nodes) in your neck. °This information is not intended to replace advice given to you by your health care provider. Make sure you discuss any questions you have with your health care provider. °Document Released: 08/07/2013 Document Revised: 09/13/2015 Document Reviewed: 04/04/2013 °Elsevier Interactive Patient Education © 2018 Elsevier Inc. ° °

## 2017-07-11 NOTE — Procedures (Signed)
US guided FNA of right thyroid nodule.  7 FNAs obtained.  Minimal blood loss and no immediate complication.

## 2017-07-13 LAB — CYTOLOGY - NON PAP

## 2018-07-10 ENCOUNTER — Encounter: Payer: Self-pay | Admitting: Physician Assistant

## 2018-07-10 ENCOUNTER — Ambulatory Visit (INDEPENDENT_AMBULATORY_CARE_PROVIDER_SITE_OTHER): Payer: Self-pay | Admitting: Physician Assistant

## 2018-07-10 ENCOUNTER — Other Ambulatory Visit: Payer: Self-pay

## 2018-07-10 VITALS — BP 151/85 | HR 85 | Temp 97.9°F | Resp 18 | Wt 216.6 lb

## 2018-07-10 DIAGNOSIS — R030 Elevated blood-pressure reading, without diagnosis of hypertension: Secondary | ICD-10-CM

## 2018-07-10 MED ORDER — AMLODIPINE BESYLATE 5 MG PO TABS: 5.0000 mg | ORAL_TABLET | Freq: Every day | ORAL | 0 refills | Status: DC

## 2018-07-10 NOTE — Patient Instructions (Signed)
If bp > 140/90 consistently, please start medication.  Hypertension Hypertension is another name for high blood pressure. High blood pressure forces your heart to work harder to pump blood. This can cause problems over time. There are two numbers in a blood pressure reading. There is a top number (systolic) over a bottom number (diastolic). It is best to have a blood pressure below 120/80. Healthy choices can help lower your blood pressure. You may need medicine to help lower your blood pressure if:  Your blood pressure cannot be lowered with healthy choices.  Your blood pressure is higher than 130/80. Follow these instructions at home: Eating and drinking   If directed, follow the DASH eating plan. This diet includes: ? Filling half of your plate at each meal with fruits and vegetables. ? Filling one quarter of your plate at each meal with whole grains. Whole grains include whole wheat pasta, brown rice, and whole grain bread. ? Eating or drinking low-fat dairy products, such as skim milk or low-fat yogurt. ? Filling one quarter of your plate at each meal with low-fat (lean) proteins. Low-fat proteins include fish, skinless chicken, eggs, beans, and tofu. ? Avoiding fatty meat, cured and processed meat, or chicken with skin. ? Avoiding premade or processed food.  Eat less than 1,500 mg of salt (sodium) a day.  Limit alcohol use to no more than 1 drink a day for nonpregnant women and 2 drinks a day for men. One drink equals 12 oz of beer, 5 oz of wine, or 1 oz of hard liquor. Lifestyle  Work with your doctor to stay at a healthy weight or to lose weight. Ask your doctor what the best weight is for you.  Get at least 30 minutes of exercise that causes your heart to beat faster (aerobic exercise) most days of the week. This may include walking, swimming, or biking.  Get at least 30 minutes of exercise that strengthens your muscles (resistance exercise) at least 3 days a week. This may  include lifting weights or pilates.  Do not use any products that contain nicotine or tobacco. This includes cigarettes and e-cigarettes. If you need help quitting, ask your doctor.  Check your blood pressure at home as told by your doctor.  Keep all follow-up visits as told by your doctor. This is important. Medicines  Take over-the-counter and prescription medicines only as told by your doctor. Follow directions carefully.  Do not skip doses of blood pressure medicine. The medicine does not work as well if you skip doses. Skipping doses also puts you at risk for problems.  Ask your doctor about side effects or reactions to medicines that you should watch for. Contact a doctor if:  You think you are having a reaction to the medicine you are taking.  You have headaches that keep coming back (recurring).  You feel dizzy.  You have swelling in your ankles.  You have trouble with your vision. Get help right away if:  You get a very bad headache.  You start to feel confused.  You feel weak or numb.  You feel faint.  You get very bad pain in your: ? Chest. ? Belly (abdomen).  You throw up (vomit) more than once.  You have trouble breathing. Summary  Hypertension is another name for high blood pressure.  Making healthy choices can help lower blood pressure. If your blood pressure cannot be controlled with healthy choices, you may need to take medicine. This information is not intended to  replace advice given to you by your health care provider. Make sure you discuss any questions you have with your health care provider. Document Released: 06/29/2007 Document Revised: 12/09/2015 Document Reviewed: 12/09/2015 Elsevier Interactive Patient Education  2019 [employer].

## 2018-07-10 NOTE — Progress Notes (Signed)
Patient: Brandon Montes Male    DOB: 1955-07-08   63 y.o.   MRN: [id number] Visit Date: 07/10/2018  Today's Provider: Trinna Post, PA-C   Chief Complaint  Patient presents with  . Hypertension   Subjective:     HPI   Patient presents today with elevated blood pressure. He states that he had an elevated blood pressure of 160/101, with some dizziness on Saturday. He is experiencing some tightness in both hands for a few weeks. His daughter who is a Marine scientist took his blood pressure. He denies sob, chest pain, nausea, vomiting. 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 He denies having history of HTN or family history of HTN.   Taking apriso for IBD.   BP Readings from Last 3 Encounters:  07/10/18 (!) 151/85  07/11/17 132/79  05/22/17 126/82     Allergies  Allergen Reactions  . Shellfish-Derived Products Nausea And Vomiting     Current Outpatient Medications:  .  APRISO 0.375 g 24 hr capsule, Take 4 capsules by mouth daily., Disp: , Rfl: 1  Review of Systems  Neurological: Positive for dizziness.  All other systems reviewed and are negative.   Social History   Tobacco Use  . Smoking status: Former Research scientist (life sciences)  . Smokeless tobacco: Former Network engineer Use Topics  . Alcohol use: No    Alcohol/week: 0.0 standard drinks      Objective:   BP (!) 151/85 (BP Location: Right Arm, Patient Position: Sitting, Cuff Size: Large)   Pulse 85   Temp 97.9 F (36.6 C) (Oral)   Resp 18   Wt 216 lb 9.6 oz (98.2 kg)   SpO2 99%   BMI 29.38 kg/m  Vitals:   07/10/18 0817  BP: (!) 151/85  Pulse: 85  Resp: 18  Temp: 97.9 F (36.6 C)  TempSrc: Oral  SpO2: 99%  Weight: 216 lb 9.6 oz (98.2 kg)     Physical Exam Constitutional:      Appearance: Normal appearance.  Cardiovascular:     Rate and Rhythm: Normal rate and regular rhythm.     Heart sounds: Normal heart sounds.  Pulmonary:   Effort: Pulmonary effort is normal.     Breath sounds: Normal breath sounds.  Skin:    General: Skin is warm and dry.  Neurological:     Mental Status: He is alert and oriented to person, place, and time. Mental status is at baseline.  Psychiatric:        Mood and Affect: Mood normal.        Behavior: Behavior normal.         Assessment & Plan    1. Elevated BP without diagnosis of hypertension  I have only one documented hypertensive reading. His additional reading was taken during period of activity. I have advised him he may continue to take his blood pressure at home once daily for the next week and if a majority of the readings are > 140/90 he may start amlodipine 5 mg daily. We will follow up in one month for CPE and follow up.   - amLODipine (NORVASC) 5 MG tablet; Take 1 tablet (5 mg total) by mouth daily.  Dispense: 90 tablet; Refill: 0  The entirety of the information documented in the History of Present Illness, Review of Systems and Physical Exam were personally obtained by me. Portions of this information were  initially documented by Doran Clay, LPN and reviewed by me for thoroughness and accuracy.         Trinna Post, PA-C  [facility]

## 2018-08-09 ENCOUNTER — Ambulatory Visit (INDEPENDENT_AMBULATORY_CARE_PROVIDER_SITE_OTHER): Payer: BC Managed Care – PPO | Admitting: Physician Assistant

## 2018-08-09 ENCOUNTER — Other Ambulatory Visit: Payer: Self-pay | Admitting: Physician Assistant

## 2018-08-09 ENCOUNTER — Other Ambulatory Visit: Payer: Self-pay

## 2018-08-09 ENCOUNTER — Encounter: Payer: Self-pay | Admitting: Physician Assistant

## 2018-08-09 VITALS — BP 138/78 | HR 77 | Temp 98.9°F | Ht 72.0 in | Wt 204.0 lb

## 2018-08-09 DIAGNOSIS — Z0000 Encounter for general adult medical examination without abnormal findings: Secondary | ICD-10-CM

## 2018-08-09 DIAGNOSIS — K5000 Crohn's disease of small intestine without complications: Secondary | ICD-10-CM | POA: Diagnosis not present

## 2018-08-09 DIAGNOSIS — Z114 Encounter for screening for human immunodeficiency virus [HIV]: Secondary | ICD-10-CM | POA: Diagnosis not present

## 2018-08-09 NOTE — Patient Instructions (Signed)
Call your insurance about Shingrix (shingles vaccine)   Health Maintenance After Age 61 After age 30, you are at a higher risk for certain long-term diseases and infections as well as injuries from falls. Falls are a major cause of broken bones and head injuries in people who are older than age 61. Getting regular preventive care can help to keep you healthy and well. Preventive care includes getting regular testing and making lifestyle changes as recommended by your health care provider. Talk with your health care provider about:  Which screenings and tests you should have. A screening is a test that checks for a disease when you have no symptoms.  A diet and exercise plan that is right for you. What should I know about screenings and tests to prevent falls? Screening and testing are the best ways to find a health problem early. Early diagnosis and treatment give you the best chance of managing medical conditions that are common after age 59. Certain conditions and lifestyle choices may make you more likely to have a fall. Your health care provider may recommend:  Regular vision checks. Poor vision and conditions such as cataracts can make you more likely to have a fall. If you wear glasses, make sure to get your prescription updated if your vision changes.  Medicine review. Work with your health care provider to regularly review all of the medicines you are taking, including over-the-counter medicines. Ask your health care provider about any side effects that may make you more likely to have a fall. Tell your health care provider if any medicines that you take make you feel dizzy or sleepy.  Osteoporosis screening. Osteoporosis is a condition that causes the bones to get weaker. This can make the bones weak and cause them to break more easily.  Blood pressure screening. Blood pressure changes and medicines to control blood pressure can make you feel dizzy.  Strength and balance checks. Your  health care provider may recommend certain tests to check your strength and balance while standing, walking, or changing positions.  Foot health exam. Foot pain and numbness, as well as not wearing proper footwear, can make you more likely to have a fall.  Depression screening. You may be more likely to have a fall if you have a fear of falling, feel emotionally low, or feel unable to do activities that you used to do.  Alcohol use screening. Using too much alcohol can affect your balance and may make you more likely to have a fall. What actions can I take to lower my risk of falls? General instructions  Talk with your health care provider about your risks for falling. Tell your health care provider if: ? You fall. Be sure to tell your health care provider about all falls, even ones that seem minor. ? You feel dizzy, sleepy, or off-balance.  Take over-the-counter and prescription medicines only as told by your health care provider. These include any supplements.  Eat a healthy diet and maintain a healthy weight. A healthy diet includes low-fat dairy products, low-fat (lean) meats, and fiber from whole grains, beans, and lots of fruits and vegetables. Home safety  Remove any tripping hazards, such as rugs, cords, and clutter.  Install safety equipment such as grab bars in bathrooms and safety rails on stairs.  Keep rooms and walkways well-lit. Activity   Follow a regular exercise program to stay fit. This will help you maintain your balance. Ask your health care provider what types of exercise are appropriate  for you.  If you need a cane or walker, use it as recommended by your health care provider.  Wear supportive shoes that have nonskid soles. Lifestyle  Do not drink alcohol if your health care provider tells you not to drink.  If you drink alcohol, limit how much you have: ? 0-1 drink a day for women. ? 0-2 drinks a day for men.  Be aware of how much alcohol is in your  drink. In the [place] one drink equals one typical bottle of beer (12 oz), one-half glass of wine (5 oz), or one shot of hard liquor (1 oz).  Do not use any products that contain nicotine or tobacco, such as cigarettes and e-cigarettes. If you need help quitting, ask your health care provider. Summary  Having a healthy lifestyle and getting preventive care can help to protect your health and wellness after age 32.  Screening and testing are the best way to find a health problem early and help you avoid having a fall. Early diagnosis and treatment give you the best chance for managing medical conditions that are more common for people who are older than age 20.  Falls are a major cause of broken bones and head injuries in people who are older than age 61. Take precautions to prevent a fall at home.  Work with your health care provider to learn what changes you can make to improve your health and wellness and to prevent falls. This information is not intended to replace advice given to you by your health care provider. Make sure you discuss any questions you have with your health care provider. Document Released: 11/23/2016 Document Revised: 05/03/2018 Document Reviewed: 11/23/2016 Elsevier Patient Education  2020 [employer].

## 2018-08-09 NOTE — Progress Notes (Signed)
Patient: Brandon Montes, Male    DOB: 1955-12-18, 63 y.o.   MRN: [id number] Visit Date: 08/09/2018  Today's Provider: Trinna Post, PA-C   Chief Complaint  Patient presents with  . Annual Exam   Subjective:     Annual physical exam Brandon Montes is a 63 y.o. male who presents today for health maintenance and complete physical. He feels well. He reports exercising 3-4 times a week. He reports he is sleeping fairly well.  -----------------------------------------------------------------   Follow up for Elevated Blood pressure:  The patient was last seen for this 1 months ago. Changes made at last visit include advising patient to continue checking blood pressure at home, and to start Amlodipine 5mg  if majority of readings are greater than 140/90. He did not end up starting amlodipine and his blood pressures have improved.  He reports good compliance with treatment. He feels that condition is Improved. He is not having side effects.  Home blood pressure readings have averaged120-130/ 75   Wt Readings from Last 3 Encounters:  08/09/18 204 lb (92.5 kg)  07/10/18 216 lb 9.6 oz (98.2 kg)  07/11/17 212 lb (96.2 kg)   Crohns: Last colonoscopy done by [facility] 05/2017 and he is followed by their GI department. He is off Apriso.   Never had shingles vaccine.  ------------------------------------------------------------------------------------  Review of Systems  Constitutional: Negative for appetite change, chills, fatigue and fever.  HENT: Positive for hearing loss and tinnitus. Negative for congestion, ear pain, nosebleeds and trouble swallowing.   Eyes: Negative for pain and visual disturbance.  Respiratory: Negative for cough, chest tightness and shortness of breath.   Cardiovascular: Positive for leg swelling. Negative for chest pain and palpitations.  Gastrointestinal: Negative for abdominal pain, blood in stool, constipation, diarrhea, nausea and vomiting.   Endocrine: Negative for polydipsia, polyphagia and polyuria.  Genitourinary: Negative for dysuria and flank pain.  Musculoskeletal: Negative for arthralgias, back pain, joint swelling, myalgias and neck stiffness.  Skin: Negative for color change, rash and wound.  Allergic/Immunologic: Positive for food allergies.  Neurological: Negative for dizziness, tremors, seizures, speech difficulty, weakness, light-headedness and headaches.  Psychiatric/Behavioral: Negative for behavioral problems, confusion, decreased concentration, dysphoric mood and sleep disturbance. The patient is not nervous/anxious.   All other systems reviewed and are negative.   Social History      He  reports that he has quit smoking. He has quit using smokeless tobacco. He reports that he does not drink alcohol or use drugs.       Social History   Socioeconomic History  . Marital status: Married    Spouse name: Not on file  . Number of children: 3  . Years of education: Not on file  . Highest education level: Not on file  Occupational History  . Occupation: [employer]  Social Needs  . Financial resource strain: Not on file  . Food insecurity    Worry: Not on file    Inability: Not on file  . Transportation needs    Medical: Not on file    Non-medical: Not on file  Tobacco Use  . Smoking status: Former Research scientist (life sciences)  . Smokeless tobacco: Former Network engineer and Sexual Activity  . Alcohol use: No    Alcohol/week: 0.0 standard drinks  . Drug use: No  . Sexual activity: Not on file  Lifestyle  . Physical activity    Days per week: Not on file    Minutes per session: Not  on file  . Stress: Not on file  Relationships  . Social Herbalist on phone: Not on file    Gets together: Not on file    Attends religious service: Not on file    Active member of club or organization: Not on file    Attends meetings of clubs or organizations: Not on file    Relationship status: Not on file  Other Topics  Concern  . Not on file  Social History Narrative  . Not on file    Past Medical History:  Diagnosis Date  . Fatty liver   . Gall stones      Patient Active Problem List   Diagnosis Date Noted  . IBD (inflammatory bowel disease) 04/08/2016  . Calculus of gallbladder without cholecystitis without obstruction 04/14/2015  . 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 Gallbladder polyp 03/31/2015    Past Surgical History:  Procedure Laterality Date  . COLONOSCOPY WITH PROPOFOL N/A 05/04/2015   Procedure: COLONOSCOPY WITH PROPOFOL;  Surgeon: Lollie Sails, MD;  Location: [facility] ENDOSCOPY;  Service: Endoscopy;  Laterality: N/A;  . CYST EXCISION     from right wrist  . CYSTECTOMY    . TONSILLECTOMY    . VASECTOMY      Family History        Family Status  Relation Name Status  . Sister  Alive  . Daughter  Alive  . Daughter  Alive  . Daughter  Alive  . Sister  Alive  . Mother  Alive  . Father  Alive  . Brother  Alive  . Brother  Alive  . PGF  (Not Specified)        His family history includes Diabetes in his father and paternal grandfather.      Allergies  Allergen Reactions  . Shellfish-Derived Products Nausea And Vomiting     Current Outpatient Medications:  .  amLODipine (NORVASC) 5 MG tablet, Take 1 tablet (5 mg total) by mouth daily. (Patient not taking: Reported on 08/09/2018), Disp: 90 tablet, Rfl: 0 .  APRISO 0.375 g 24 hr capsule, Take 4 capsules by mouth daily., Disp: , Rfl: 1   Patient Care Team: Paulene Floor as PCP - General (Physician Assistant)    Objective:    Vitals: BP 138/78 (BP Location: Left Arm, Patient Position: Sitting, Cuff Size: Large)   Pulse 77   Temp 98.9 F (37.2 C) (Oral)   Ht 6' (1.829 m)   Wt 204 lb (92.5 kg)   SpO2 98% Comment: room air  BMI 27.67 kg/m    Vitals:   08/09/18 1013  BP: 138/78  Pulse: 77  Temp: 98.9 F (37.2 C)  TempSrc: Oral  SpO2: 98%  Weight: 204 lb (92.5 kg)  Height: 6' (1.829 m)      Physical Exam Constitutional:      Appearance: Normal appearance.  Cardiovascular:     Rate and Rhythm: Normal rate and regular rhythm.     Heart sounds: Normal heart sounds.  Pulmonary:     Effort: Pulmonary effort is normal.     Breath sounds: Normal breath sounds.  Abdominal:     General: Abdomen is flat. Bowel sounds are normal.  Skin:    General: Skin is warm and dry.  Neurological:     Mental Status: He is alert and oriented to person, place, and time. Mental status is at baseline.  Psychiatric:        Mood and Affect:  Mood normal.        Behavior: Behavior normal.      Depression Screen PHQ 2/9 Scores 08/09/2018 07/10/2018 05/22/2017 04/08/2016  PHQ - 2 Score 0 0 0 0  PHQ- 9 Score 0 - - 0       Assessment & Plan:     Routine Health Maintenance and Physical Exam  Exercise Activities and Dietary recommendations Goals   None     Immunization History  Administered Date(s) Administered  . Influenza,inj,Quad PF,6+ Mos 04/08/2016  . Td 04/02/2015  . Tdap 08/11/2005    Health Maintenance  Topic Date Due  . HIV Screening  04/17/1972  . INFLUENZA VACCINE  08/25/2018  . TETANUS/TDAP  04/01/2025  . COLONOSCOPY  05/03/2025  . Hepatitis C Screening  Completed     Discussed health benefits of physical activity, and encouraged him to engage in regular exercise appropriate for his age and condition.    1. Annual physical exam  Elevated BP resolved, he did not end up having to take amlodipine. Labs as below. Follow up in 1 year for CPE. Discussed shingrix vaccine, advised to check with insurance.   - PSA - Lipid Profile - Comprehensive Metabolic Panel (CMET) - CBC with Differential - TSH  2. Encounter for screening for HIV  - HIV antibody (with reflex)  3. 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 Off Apriso. Continues to folllow up with [place].   The entirety of the information documented in  the History of Present Illness, Review of Systems and Physical Exam were personally obtained by me. Portions of this information were initially documented by Meyer Cory, CMA and reviewed by me for thoroughness and accuracy.    --------------------------------------------------------------------    Trinna Post, PA-C  [facility]

## 2018-08-10 ENCOUNTER — Ambulatory Visit (INDEPENDENT_AMBULATORY_CARE_PROVIDER_SITE_OTHER): Payer: BC Managed Care – PPO | Admitting: Physician Assistant

## 2018-08-10 ENCOUNTER — Encounter: Payer: Self-pay | Admitting: Physician Assistant

## 2018-08-10 ENCOUNTER — Telehealth: Payer: Self-pay

## 2018-08-10 DIAGNOSIS — R509 Fever, unspecified: Secondary | ICD-10-CM | POA: Diagnosis not present

## 2018-08-10 DIAGNOSIS — R21 Rash and other nonspecific skin eruption: Secondary | ICD-10-CM | POA: Diagnosis not present

## 2018-08-10 LAB — CBC WITH DIFFERENTIAL/PLATELET
Basophils Absolute: 0 10*3/uL (ref 0.0–0.2)
Basos: 0 %
EOS (ABSOLUTE): 0 10*3/uL (ref 0.0–0.4)
Eos: 0 %
Hematocrit: 42.9 % (ref 37.5–51.0)
Hemoglobin: 14.2 g/dL (ref 13.0–17.7)
Immature Grans (Abs): 0 10*3/uL (ref 0.0–0.1)
Immature Granulocytes: 0 %
Lymphocytes Absolute: 0.9 10*3/uL (ref 0.7–3.1)
Lymphs: 18 %
MCH: 28.6 pg (ref 26.6–33.0)
MCHC: 33.1 g/dL (ref 31.5–35.7)
MCV: 86 fL (ref 79–97)
Monocytes Absolute: 0.6 10*3/uL (ref 0.1–0.9)
Monocytes: 13 %
Neutrophils Absolute: 3.4 10*3/uL (ref 1.4–7.0)
Neutrophils: 69 %
Platelets: 174 10*3/uL (ref 150–450)
RBC: 4.97 x10E6/uL (ref 4.14–5.80)
RDW: 13.1 % (ref 11.6–15.4)
WBC: 5 10*3/uL (ref 3.4–10.8)

## 2018-08-10 LAB — LIPID PANEL
Chol/HDL Ratio: 3.3 ratio (ref 0.0–5.0)
Cholesterol, Total: 122 mg/dL (ref 100–199)
HDL: 37 mg/dL — ABNORMAL LOW (ref >39)
LDL Calculated: 69 mg/dL (ref 0–99)
Triglycerides: 78 mg/dL (ref 0–149)
VLDL Cholesterol Cal: 16 mg/dL (ref 5–40)

## 2018-08-10 LAB — COMPREHENSIVE METABOLIC PANEL
ALT: 13 IU/L (ref 0–44)
AST: 15 IU/L (ref 0–40)
Albumin/Globulin Ratio: 2 (ref 1.2–2.2)
Albumin: 4.6 g/dL (ref 3.8–4.8)
Alkaline Phosphatase: 61 IU/L (ref 39–117)
BUN/Creatinine Ratio: 18 (ref 10–24)
BUN: 16 mg/dL (ref 8–27)
Bilirubin Total: 0.7 mg/dL (ref 0.0–1.2)
CO2: 23 mmol/L (ref 20–29)
Calcium: 9.5 mg/dL (ref 8.6–10.2)
Chloride: 98 mmol/L (ref 96–106)
Creatinine, Ser: 0.89 mg/dL (ref 0.76–1.27)
GFR calc Af Amer: 107 mL/min/{1.73_m2} (ref >59)
GFR calc non Af Amer: 92 mL/min/{1.73_m2} (ref >59)
Globulin, Total: 2.3 g/dL (ref 1.5–4.5)
Glucose: 95 mg/dL (ref 65–99)
Potassium: 4.5 mmol/L (ref 3.5–5.2)
Sodium: 137 mmol/L (ref 134–144)
Total Protein: 6.9 g/dL (ref 6.0–8.5)

## 2018-08-10 LAB — HIV ANTIBODY (ROUTINE TESTING W REFLEX): HIV Screen 4th Generation wRfx: NONREACTIVE

## 2018-08-10 LAB — TSH: TSH: 1.14 u[IU]/mL (ref 0.450–4.500)

## 2018-08-10 LAB — PSA: Prostate Specific Ag, Serum: 0.4 ng/mL (ref 0.0–4.0)

## 2018-08-10 MED ORDER — DOXYCYCLINE HYCLATE 100 MG PO TABS: 100.0000 mg | ORAL_TABLET | Freq: Two times a day (BID) | ORAL | 0 refills | Status: AC

## 2018-08-10 NOTE — Progress Notes (Signed)
Subjective:    Patient ID: Brandon Montes, male    DOB: [birth year], 63 y.o.   MRN: [id number]  Brandon Montes is a 63 y.o. male presenting on 08/10/2018 for Fever and Chills  Virtual Visit via Telephone Note  I connected with Doree Barthel on 08/10/18 at  8:40 AM EDT by telephone and verified that I am speaking with the correct person using two identifiers.   I discussed the limitations, risks, security and privacy concerns of performing an evaluation and management service by telephone and the availability of in person appointments. I also discussed with the patient that there may be a patient responsible charge related to this service. The patient expressed understanding and agreed to proceed.  Patient location: home Provider location: [place] office  Persons involved in the visit: patient, provider    HPI   Patient with history of Crohn's who reports yesterday afternoon he went out to work and began feeling feverish or chilly. Maximum temperature was 99.360F and 97.60F. Did take tylenol. Did not vomit or feel nauseated. Not diarrhea, abdominal pain, blood in stool. Nobody around him is sick. He does continue to work but his colleagues are not sick. Noticed a red spot on his abdomen that has been spreading. No shortness of breath.   Social History   Tobacco Use  . Smoking status: Former Research scientist (life sciences)  . Smokeless tobacco: Former Network engineer Use Topics  . Alcohol use: No    Alcohol/week: 0.0 standard drinks  . Drug use: No    Review of Systems Per HPI unless specifically indicated above     Objective:    There were no vitals taken for this visit.  Wt Readings from Last 3 Encounters:  08/09/18 204 lb (92.5 kg)  07/10/18 216 lb 9.6 oz (98.2 kg)  07/11/17 212 lb (96.2 kg)    Physical Exam    Results for orders placed or performed in visit on 08/09/18  CBC with Differential/Platelet  Result Value Ref Range   WBC 5.0 3.4 - 10.8 x10E3/uL   RBC 4.97  4.14 - 5.80 x10E6/uL   Hemoglobin 14.2 13.0 - 17.7 g/dL   Hematocrit 42.9 37.5 - 51.0 %   MCV 86 79 - 97 fL   MCH 28.6 26.6 - 33.0 pg   MCHC 33.1 31.5 - 35.7 g/dL   RDW 13.1 11.6 - 15.4 %   Platelets 174 150 - 450 x10E3/uL   Neutrophils 69 Not Estab. %   Lymphs 18 Not Estab. %   Monocytes 13 Not Estab. %   Eos 0 Not Estab. %   Basos 0 Not Estab. %   Neutrophils Absolute 3.4 1.4 - 7.0 x10E3/uL   Lymphocytes Absolute 0.9 0.7 - 3.1 x10E3/uL   Monocytes Absolute 0.6 0.1 - 0.9 x10E3/uL   EOS (ABSOLUTE) 0.0 0.0 - 0.4 x10E3/uL   Basophils Absolute 0.0 0.0 - 0.2 x10E3/uL   Immature Granulocytes 0 Not Estab. %   Immature Grans (Abs) 0.0 0.0 - 0.1 x10E3/uL  Comprehensive metabolic panel  Result Value Ref Range   Glucose 95 65 - 99 mg/dL   BUN 16 8 - 27 mg/dL   Creatinine, Ser 0.89 0.76 - 1.27 mg/dL   GFR calc non Af Amer 92 >59 mL/min/1.73   GFR calc Af Amer 107 >59 mL/min/1.73   BUN/Creatinine Ratio 18 10 - 24   Sodium 137 134 - 144 mmol/L   Potassium 4.5 3.5 - 5.2 mmol/L   Chloride 98 96 -  106 mmol/L   CO2 23 20 - 29 mmol/L   Calcium 9.5 8.6 - 10.2 mg/dL   Total Protein 6.9 6.0 - 8.5 g/dL   Albumin 4.6 3.8 - 4.8 g/dL   Globulin, Total 2.3 1.5 - 4.5 g/dL   Albumin/Globulin Ratio 2.0 1.2 - 2.2   Bilirubin Total 0.7 0.0 - 1.2 mg/dL   Alkaline Phosphatase 61 39 - 117 IU/L   AST 15 0 - 40 IU/L   ALT 13 0 - 44 IU/L  Lipid panel  Result Value Ref Range   Cholesterol, Total 122 100 - 199 mg/dL   Triglycerides 78 0 - 149 mg/dL   HDL 37 (L) >39 mg/dL   VLDL Cholesterol Cal 16 5 - 40 mg/dL   LDL Calculated 69 0 - 99 mg/dL   Chol/HDL Ratio 3.3 0.0 - 5.0 ratio  TSH  Result Value Ref Range   TSH 1.140 0.450 - 4.500 uIU/mL  PSA  Result Value Ref Range   Prostate Specific Ag, Serum 0.4 0.0 - 4.0 ng/mL  HIV Antibody (routine testing w rflx)  Result Value Ref Range   HIV Screen 4th Generation wRfx Non Reactive Non Reactive      Assessment & Plan:  1. Fever, unspecified fever  cause  Will send for COVID testing.   2. Rash  Looks concerning for tick bite or skin infection based on picture patient sent. Will treat with doxycycline.   - doxycycline (VIBRA-TABS) 100 MG tablet; Take 1 tablet (100 mg total) by mouth 2 (two) times daily for 7 days.  Dispense: 14 tablet; Refill: 0   Follow up plan: Return if symptoms worsen or fail to improve.   Carles Collet, PA-C [facility] 08/13/2018, 6:48 PM

## 2018-08-10 NOTE — Telephone Encounter (Signed)
No, tell him I will order it and call him at around noon. Put him on a slot somewhere so there is an encounter and direct him to the [facility].

## 2018-08-10 NOTE — Telephone Encounter (Signed)
Patient reports that he was just seen in the office for a CPE yesterday, and felt fine. He woke up this morning feeling achy, has a temp up to 100.1, and chills. He denies cough or sore throat. Just "feels bad". Should patient continue to monitor, or does he need to have a virtual visit and possible covid testing?   Contact info is correct. Please advise. Thanks!

## 2018-08-10 NOTE — Telephone Encounter (Signed)
Please advise 

## 2018-08-10 NOTE — Telephone Encounter (Signed)
Patient notified. He states that he has a red spot above his naval that is red, itchy and warm to the touch. He thinks it has been there for about a week.

## 2018-08-13 NOTE — Patient Instructions (Signed)
Fever, Adult     A fever is an increase in your body's temperature. It often means a temperature of 100.4F (38C) or higher. Brief mild or moderate fevers often have no long-term effects. They often do not need treatment. Moderate or high fevers may make you feel uncomfortable. Sometimes, they can be a sign of a serious illness or disease. A fever that keeps coming back or that lasts a long time may cause you to lose water in your body (get dehydrated). You can take your temperature with a thermometer to see if you have a fever. Temperature can change with:  Age.  Time of day.  Where the thermometer is put in the body. Readings may vary when the thermometer is put: ? In the mouth (oral). ? In the butt (rectal). ? In the ear (tympanic). ? Under the arm (axillary). ? On the forehead (temporal). Follow these instructions at home: Medicines  Take over-the-counter and prescription medicines only as told by your doctor. Follow the dosing instructions carefully.  If you were prescribed an antibiotic medicine, take it as told by your doctor. Do not stop taking it even if you start to feel better. General instructions  Watch for any changes in your symptoms. Tell your doctor about them.  Rest as needed.  Drink enough fluid to keep your pee (urine) pale yellow.  Sponge yourself or bathe with room-temperature water as needed. This helps to lower your body temperature. Do not use ice water.  Do not use too many blankets or wear clothes that are too heavy.  If your fever was caused by an infection that spreads from person to person (is contagious), such as a cold or the flu: ? You should stay home from work and public places for at least 24 hours after your fever is gone. ? Your fever should be gone for at least 24 hours without the need to use medicines. Contact a doctor if:  You throw up (vomit).  You cannot eat or drink without throwing up.  You have watery poop (diarrhea).  It  hurts when you pee.  Your symptoms do not get better with treatment.  You have new symptoms.  You feel very weak. Get help right away if:  You are short of breath or have trouble breathing.  You are dizzy or you pass out (faint).  You feel mixed up (confused).  You have signs of not having enough water in your body, such as: ? Dark pee, very little pee, or no pee. ? Cracked lips. ? Dry mouth. ? Sunken eyes. ? Sleepiness. ? Weakness.  You have very bad pain in your belly (abdomen).  You keep throwing up or having watery poop.  You have a rash on your skin.  Your symptoms get worse all of a sudden. Summary  A fever is an increase in your body's temperature. It often means a temperature of 100.4F (38C) or higher.  Watch for any changes in your symptoms. Tell your doctor about them.  Take all medicines only as told by your doctor.  Do not go to work or other public places if your fever was caused by an illness that can spread to other people.  Get help right away if you have signs that you do not have enough water in your body. This information is not intended to replace advice given to you by your health care provider. Make sure you discuss any questions you have with your health care provider. Document Released: 10/20/2007   Document Revised: 06/26/2017 Document Reviewed: 06/26/2017 Elsevier Patient Education  2020 Elsevier Inc.  

## 2018-08-14 ENCOUNTER — Telehealth: Payer: Self-pay | Admitting: Physician Assistant

## 2018-08-14 NOTE — Telephone Encounter (Signed)
Pt calling to check on COVID test results. Please call pt back when they are done.  Thanks, [employer]

## 2018-08-14 NOTE — Telephone Encounter (Signed)
Patient was advised that his results are not back yet.

## 2018-08-15 ENCOUNTER — Telehealth: Payer: Self-pay

## 2018-08-15 LAB — NOVEL CORONAVIRUS, NAA: SARS-CoV-2, NAA: NOT DETECTED

## 2018-08-15 NOTE — Telephone Encounter (Signed)
-----   Message from Trinna Post, [place] sent at 08/15/2018  9:43 AM EDT ----- Coronavirus test negative. How is he feeling?

## 2018-08-15 NOTE — Telephone Encounter (Signed)
Patient was advised and states that he feels fine.

## 2018-10-05 ENCOUNTER — Other Ambulatory Visit: Payer: Self-pay | Admitting: Physician Assistant

## 2018-10-05 DIAGNOSIS — R030 Elevated blood-pressure reading, without diagnosis of hypertension: Secondary | ICD-10-CM

## 2019-12-11 ENCOUNTER — Ambulatory Visit (INDEPENDENT_AMBULATORY_CARE_PROVIDER_SITE_OTHER): Payer: BC Managed Care – PPO | Admitting: Podiatry

## 2019-12-11 ENCOUNTER — Ambulatory Visit (INDEPENDENT_AMBULATORY_CARE_PROVIDER_SITE_OTHER): Payer: BC Managed Care – PPO

## 2019-12-11 ENCOUNTER — Encounter: Payer: Self-pay | Admitting: Podiatry

## 2019-12-11 ENCOUNTER — Other Ambulatory Visit: Payer: Self-pay

## 2019-12-11 DIAGNOSIS — M722 Plantar fascial fibromatosis: Secondary | ICD-10-CM

## 2019-12-11 NOTE — Progress Notes (Signed)
  Subjective:  Patient ID: Brandon Montes, male    DOB: 30-Sep-1955,  MRN: [id number]  Chief Complaint  Patient presents with  . Foot Pain    Patient presens today for bilat foot pain in arches and knots on arches x years.  He says it aches all the time, but pain is worse when working or walking alot.  He states" It feels like Im walking on golf balls"    64 y.o. male presents with the above complaint. History confirmed with patient.  He works at a [employer] and the masses are painful in his boots  Objective:  Physical Exam: warm, good capillary refill, no trophic changes or ulcerative lesions, normal DP and PT pulses and normal sensory exam. Left Foot: Smaller firm nonmobile mass in the medial band of the plantar fascia just proximal to the sesamoid complex.  Moves with engagement of plantar fascia Right Foot: Firm nonmobile mass in the medial band of the plantar fascia mid arch, this moves with the engagement of the plantar fascia.  Approximate 1 cm diameter  Radiographs: X-ray of both feet: no fracture, dislocation, swelling or degenerative changes noted Assessment:   1. Plantar fibromatosis      Plan:  Patient was evaluated and treated and all questions answered.  Discussed with him the etiology and treatment options in detail for plantar fibromas.  We discussed injection therapy and surgical excision.  I discussed with him that surgical excision carries risk of recurrence and that these are difficult to treat and this would required an incision on the instep or plantar foot.  I recommended we try injection therapy prior to excision.  Following sterile prep with alcohol and local field block with 1.5 cc of 2% Xylocaine plain, the fibroma on each foot was injected with 10 mg of Kenalog and 2 mg of dexamethasone phosphate.  He tolerated procedure well and they were dressed with a Band-Aid.  Return in about 3 months (around 03/12/2020).

## 2020-03-16 ENCOUNTER — Ambulatory Visit: Payer: BC Managed Care – PPO | Admitting: Podiatry

## 2020-05-13 ENCOUNTER — Other Ambulatory Visit: Payer: Self-pay

## 2020-05-13 ENCOUNTER — Ambulatory Visit (INDEPENDENT_AMBULATORY_CARE_PROVIDER_SITE_OTHER): Payer: BC Managed Care – PPO | Admitting: Podiatry

## 2020-05-13 ENCOUNTER — Encounter: Payer: Self-pay | Admitting: Podiatry

## 2020-05-13 DIAGNOSIS — M21862 Other specified acquired deformities of left lower leg: Secondary | ICD-10-CM

## 2020-05-13 DIAGNOSIS — M216X2 Other acquired deformities of left foot: Secondary | ICD-10-CM

## 2020-05-13 DIAGNOSIS — M7662 Achilles tendinitis, left leg: Secondary | ICD-10-CM

## 2020-05-13 MED ORDER — MELOXICAM 15 MG PO TABS: 15.0000 mg | ORAL_TABLET | Freq: Every day | ORAL | 3 refills | Status: DC

## 2020-05-13 NOTE — Progress Notes (Signed)
  Subjective:  Patient ID: Brandon Montes, male    DOB: 03-16-1955,  MRN: [id number]  Chief Complaint  Patient presents with  . Foot Pain    Patient presents today for back of left heel/achilles pain x 2-3 months.  He says its a constant stabbing pain, worse when he takes a step with that foot.  He also says its tender to touch on the back of his heel    65 y.o. male presents with the above complaint. History confirmed with patient.   Objective:  Physical Exam: warm, good capillary refill, no trophic changes or ulcerative lesions, normal DP and PT pulses and normal sensory exam. Left Foot: PoP to posterior heel at Achilles insertion, gastroc equinus    Assessment:   1. Achilles tendinitis of left lower extremity      Plan:  Patient was evaluated and treated and all questions answered.  Discussed the etiology and treatment options for achilles tendinitis including stretching, formal physical therapy, supportive shoegears such as a running shoe or sneaker,and oral medications. We also discussed the role of surgical treatment of this for patients who do not improve after exhausting non-surgical treatment options.   Achilles Tendonitis -XR reviewed with patient -Educated on stretching and icing of the affected limb. -Referral placed to physical therapy. -Rx for Meloxicam. Advised on risks, benefits, and alternatives of the medication  -Heel lifts dispensed for his work boots. 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

## 2020-05-13 NOTE — Patient Instructions (Signed)

## 2020-05-27 DIAGNOSIS — M25572 Pain in left ankle and joints of left foot: Secondary | ICD-10-CM | POA: Diagnosis not present

## 2020-06-02 DIAGNOSIS — M25572 Pain in left ankle and joints of left foot: Secondary | ICD-10-CM | POA: Diagnosis not present

## 2020-06-05 DIAGNOSIS — M25572 Pain in left ankle and joints of left foot: Secondary | ICD-10-CM | POA: Diagnosis not present

## 2020-06-09 DIAGNOSIS — M25572 Pain in left ankle and joints of left foot: Secondary | ICD-10-CM | POA: Diagnosis not present

## 2020-06-12 DIAGNOSIS — M25572 Pain in left ankle and joints of left foot: Secondary | ICD-10-CM | POA: Diagnosis not present

## 2020-06-17 DIAGNOSIS — M25572 Pain in left ankle and joints of left foot: Secondary | ICD-10-CM | POA: Diagnosis not present

## 2020-06-25 DIAGNOSIS — M25572 Pain in left ankle and joints of left foot: Secondary | ICD-10-CM | POA: Diagnosis not present

## 2020-07-08 ENCOUNTER — Encounter: Payer: Self-pay | Admitting: Podiatry

## 2020-07-08 ENCOUNTER — Other Ambulatory Visit: Payer: Self-pay

## 2020-07-08 ENCOUNTER — Ambulatory Visit (INDEPENDENT_AMBULATORY_CARE_PROVIDER_SITE_OTHER): Payer: BC Managed Care – PPO | Admitting: Podiatry

## 2020-07-08 DIAGNOSIS — M216X2 Other acquired deformities of left foot: Secondary | ICD-10-CM

## 2020-07-08 DIAGNOSIS — M21862 Other specified acquired deformities of left lower leg: Secondary | ICD-10-CM

## 2020-07-08 DIAGNOSIS — M7662 Achilles tendinitis, left leg: Secondary | ICD-10-CM | POA: Diagnosis not present

## 2020-07-08 NOTE — Progress Notes (Signed)
  Subjective:  Patient ID: Brandon Montes, male    DOB: [birth year],  MRN: [id number]  Chief Complaint  Patient presents with   Tendonitis    "Its a lot better than what it was but it still hurts when I play softball and have to run"     66 y.o. male returns with the above complaint. History confirmed with patient.  Completed therapy, he feels he is 85% better  Objective:  Physical Exam: warm, good capillary refill, no trophic changes or ulcerative lesions, normal DP and PT pulses and normal sensory exam. Left Foot: Mild PoP to posterior heel at Achilles insertion, gastroc equinus    Assessment:   1. Achilles tendinitis of left lower extremity   2. Gastrocnemius equinus of left lower extremity       Plan:  Patient was evaluated and treated and all questions answered.  Discussed the etiology and treatment options for achilles tendinitis including stretching, formal physical therapy, supportive shoegears such as a running shoe or sneaker,and oral medications. We also discussed the role of surgical treatment of this for patients who do not improve after exhausting non-surgical treatment options.   Achilles Tendonitis -Overall doing better.  I think this will continue to resolve with his own home therapy exercises.   -Continue meloxicam as needed. -Discussed if he is not improving may need.  Of CAM boot immobilization and additional treatment with either EPAT ultrasound treatment or platelet rich plasma injection   Return if symptoms worsen or fail to improve by mid-August or so.

## 2020-07-27 ENCOUNTER — Encounter: Payer: Self-pay | Admitting: Emergency Medicine

## 2020-07-27 ENCOUNTER — Other Ambulatory Visit: Payer: Self-pay

## 2020-07-27 ENCOUNTER — Observation Stay
Admission: EM | Admit: 2020-07-27 | Discharge: 2020-07-29 | DRG: 387 | Disposition: A | Discharge status: Home / Self Care | Payer: BC Managed Care – PPO | Attending: Internal Medicine | Admitting: Internal Medicine

## 2020-07-27 ENCOUNTER — Inpatient Hospital Stay: Payer: BC Managed Care – PPO

## 2020-07-27 ENCOUNTER — Emergency Department: Payer: BC Managed Care – PPO

## 2020-07-27 DIAGNOSIS — R109 Unspecified abdominal pain: Secondary | ICD-10-CM | POA: Diagnosis not present

## 2020-07-27 DIAGNOSIS — K8020 Calculus of gallbladder without cholecystitis without obstruction: Secondary | ICD-10-CM | POA: Diagnosis not present

## 2020-07-27 DIAGNOSIS — Z20822 Contact with and (suspected) exposure to covid-19: Secondary | ICD-10-CM | POA: Insufficient documentation

## 2020-07-27 DIAGNOSIS — R933 Abnormal findings on diagnostic imaging of other parts of digestive tract: Secondary | ICD-10-CM

## 2020-07-27 DIAGNOSIS — K56609 Unspecified intestinal obstruction, unspecified as to partial versus complete obstruction: Principal | ICD-10-CM | POA: Diagnosis present

## 2020-07-27 DIAGNOSIS — K529 Noninfective gastroenteritis and colitis, unspecified: Secondary | ICD-10-CM | POA: Diagnosis not present

## 2020-07-27 DIAGNOSIS — Z791 Long term (current) use of non-steroidal anti-inflammatories (NSAID): Secondary | ICD-10-CM | POA: Diagnosis not present

## 2020-07-27 DIAGNOSIS — R110 Nausea: Secondary | ICD-10-CM

## 2020-07-27 DIAGNOSIS — K639 Disease of intestine, unspecified: Secondary | ICD-10-CM | POA: Diagnosis not present

## 2020-07-27 DIAGNOSIS — K5000 Crohn's disease of small intestine without complications: Secondary | ICD-10-CM | POA: Diagnosis not present

## 2020-07-27 DIAGNOSIS — Z833 Family history of diabetes mellitus: Secondary | ICD-10-CM | POA: Diagnosis not present

## 2020-07-27 DIAGNOSIS — R1084 Generalized abdominal pain: Secondary | ICD-10-CM | POA: Diagnosis not present

## 2020-07-27 DIAGNOSIS — Z87891 Personal history of nicotine dependence: Secondary | ICD-10-CM | POA: Insufficient documentation

## 2020-07-27 DIAGNOSIS — Z91013 Allergy to seafood: Secondary | ICD-10-CM | POA: Diagnosis not present

## 2020-07-27 DIAGNOSIS — D72829 Elevated white blood cell count, unspecified: Secondary | ICD-10-CM | POA: Diagnosis present

## 2020-07-27 DIAGNOSIS — K760 Fatty (change of) liver, not elsewhere classified: Secondary | ICD-10-CM | POA: Diagnosis not present

## 2020-07-27 DIAGNOSIS — K50012 Crohn's disease of small intestine with intestinal obstruction: Secondary | ICD-10-CM | POA: Diagnosis not present

## 2020-07-27 DIAGNOSIS — T39395A Adverse effect of other nonsteroidal anti-inflammatory drugs [NSAID], initial encounter: Secondary | ICD-10-CM | POA: Diagnosis not present

## 2020-07-27 DIAGNOSIS — R1013 Epigastric pain: Secondary | ICD-10-CM | POA: Diagnosis not present

## 2020-07-27 LAB — CBC
HCT: 50.2 % (ref 39.0–52.0)
Hemoglobin: 16.4 g/dL (ref 13.0–17.0)
MCH: 28.1 pg (ref 26.0–34.0)
MCHC: 32.7 g/dL (ref 30.0–36.0)
MCV: 86.1 fL (ref 80.0–100.0)
Platelets: 202 10*3/uL (ref 150–400)
RBC: 5.83 MIL/uL — ABNORMAL HIGH (ref 4.22–5.81)
RDW: 13.9 % (ref 11.5–15.5)
WBC: 12.8 10*3/uL — ABNORMAL HIGH (ref 4.0–10.5)
nRBC: 0 % (ref 0.0–0.2)

## 2020-07-27 LAB — COMPREHENSIVE METABOLIC PANEL
ALT: 21 U/L (ref 0–44)
AST: 24 U/L (ref 15–41)
Albumin: 4.8 g/dL (ref 3.5–5.0)
Alkaline Phosphatase: 75 U/L (ref 38–126)
Anion gap: 11 (ref 5–15)
BUN: 25 mg/dL — ABNORMAL HIGH (ref 8–23)
CO2: 24 mmol/L (ref 22–32)
Calcium: 9.7 mg/dL (ref 8.9–10.3)
Chloride: 102 mmol/L (ref 98–111)
Creatinine, Ser: 1.04 mg/dL (ref 0.61–1.24)
GFR, Estimated: >60 mL/min (ref >60)
Glucose, Bld: 139 mg/dL — ABNORMAL HIGH (ref 70–99)
Potassium: 4.6 mmol/L (ref 3.5–5.1)
Sodium: 137 mmol/L (ref 135–145)
Total Bilirubin: 0.9 mg/dL (ref 0.3–1.2)
Total Protein: 8.4 g/dL — ABNORMAL HIGH (ref 6.5–8.1)

## 2020-07-27 LAB — URINALYSIS, COMPLETE (UACMP) WITH MICROSCOPIC
Bacteria, UA: NONE SEEN
Bilirubin Urine: NEGATIVE
Glucose, UA: NEGATIVE mg/dL
Hgb urine dipstick: NEGATIVE
Ketones, ur: NEGATIVE mg/dL
Leukocytes,Ua: NEGATIVE
Nitrite: NEGATIVE
Protein, ur: NEGATIVE mg/dL
Specific Gravity, Urine: 1.026 (ref 1.005–1.030)
Squamous Epithelial / HPF: NONE SEEN (ref 0–5)
pH: 5 (ref 5.0–8.0)

## 2020-07-27 LAB — APTT: aPTT: 23 seconds — ABNORMAL LOW (ref 24–36)

## 2020-07-27 LAB — PROTIME-INR
INR: 1.2 (ref 0.8–1.2)
Prothrombin Time: 14.7 seconds (ref 11.4–15.2)

## 2020-07-27 LAB — SARS CORONAVIRUS 2 (TAT 6-24 HRS): SARS Coronavirus 2: NEGATIVE

## 2020-07-27 LAB — C-REACTIVE PROTEIN: CRP: 1.1 mg/dL — ABNORMAL HIGH (ref <1.0)

## 2020-07-27 LAB — LIPASE, BLOOD: Lipase: 31 U/L (ref 11–51)

## 2020-07-27 MED ORDER — ONDANSETRON HCL 4 MG/2ML IJ SOLN
4.0000 mg | Freq: Once | INTRAMUSCULAR | Status: AC
  Administered 2020-07-27: 4 mg via INTRAVENOUS
  Filled 2020-07-27: qty 2

## 2020-07-27 MED ORDER — MORPHINE SULFATE (PF) 4 MG/ML IV SOLN
4.0000 mg | Freq: Once | INTRAVENOUS | Status: AC
  Administered 2020-07-27: 4 mg via INTRAVENOUS
  Filled 2020-07-27: qty 1

## 2020-07-27 MED ORDER — PIPERACILLIN-TAZOBACTAM 3.375 G IVPB
3.3750 g | Freq: Three times a day (TID) | INTRAVENOUS | Status: DC
  Administered 2020-07-27 – 2020-07-29 (×8): 3.375 g via INTRAVENOUS
  Filled 2020-07-27 (×6): qty 50

## 2020-07-27 MED ORDER — IOHEXOL 300 MG/ML  SOLN
100.0000 mL | Freq: Once | INTRAMUSCULAR | Status: AC | PRN
  Administered 2020-07-27: 100 mL via INTRAVENOUS

## 2020-07-27 MED ORDER — IOHEXOL 9 MG/ML PO SOLN
1000.0000 mL | Freq: Once | ORAL | Status: AC
  Administered 2020-07-27: 1000 mL via ORAL

## 2020-07-27 MED ORDER — MORPHINE SULFATE (PF) 2 MG/ML IV SOLN
2.0000 mg | INTRAVENOUS | Status: DC | PRN
  Administered 2020-07-27 – 2020-07-28 (×5): 2 mg via INTRAVENOUS
  Filled 2020-07-27 (×5): qty 1

## 2020-07-27 MED ORDER — HYDROMORPHONE HCL 1 MG/ML IJ SOLN
1.0000 mg | Freq: Once | INTRAMUSCULAR | Status: AC
Start: 2020-07-27 — End: 2020-07-27
  Administered 2020-07-27: 1 mg via INTRAVENOUS
  Filled 2020-07-27: qty 1

## 2020-07-27 MED ORDER — ONDANSETRON HCL 4 MG/2ML IJ SOLN
4.0000 mg | Freq: Three times a day (TID) | INTRAMUSCULAR | Status: DC | PRN
  Administered 2020-07-27: 4 mg via INTRAVENOUS
  Filled 2020-07-27: qty 2

## 2020-07-27 MED ORDER — LACTATED RINGERS IV BOLUS
1000.0000 mL | Freq: Once | INTRAVENOUS | Status: AC
  Administered 2020-07-27: 1000 mL via INTRAVENOUS

## 2020-07-27 MED ORDER — ACETAMINOPHEN 325 MG PO TABS
650.0000 mg | ORAL_TABLET | Freq: Four times a day (QID) | ORAL | Status: DC | PRN
Start: 2020-07-27 — End: 2020-07-29

## 2020-07-27 MED ORDER — LACTATED RINGERS IV SOLN: INTRAVENOUS | Status: DC

## 2020-07-27 NOTE — Consult Note (Signed)
Pharmacy Antibiotic Note  Brandon Montes is a 66 y.o. male admitted on 07/27/2020 with  enteritis and possible Chron disease .  Pharmacy has been consulted for Zosyn dosing.  Plan: Zosyn 3.375 g IV extended infusion q8h  Monitor clinical picture and renal function F/U C&S, abx deescalation / LOT  Height: 6' (182.9 cm) Weight: 97.5 kg (215 lb) IBW/kg (Calculated) : 77.6  Temp (24hrs), Avg:97.5 F (36.4 C), Min:97.5 F (36.4 C), Max:97.5 F (36.4 C)  Recent Labs  Lab 07/27/20 0815  WBC 12.8*  CREATININE 1.04    Estimated Creatinine Clearance: 88 mL/min (by C-G formula based on SCr of 1.04 mg/dL).    Allergies  Allergen Reactions   Shellfish-Derived Products Nausea And Vomiting    Antimicrobials this admission: 7/4 Zosyn >>  Dose adjustments this admission: N/A  Microbiology results: No cultures ordered at this time  Thank you for allowing pharmacy to be a part of this patient's care.  Darnelle Bos, PharmD 07/27/2020 3:43 PM

## 2020-07-27 NOTE — Progress Notes (Signed)
Patient arrived to room.  Ambulated to bed independently.  Patient alert and oriented. Oriented to room and call bell.

## 2020-07-27 NOTE — ED Triage Notes (Signed)
Pt via POV from home. Pt epigastric pain since 0100 this morning. Pt states he has not been able to sleep. Denies NVD. Denies fever. Pt is A&Ox4 and NAD. Ambulatory to triage.

## 2020-07-27 NOTE — Consult Note (Signed)
Brandon Montes , MD [address] [place], [place], [place], [address] [address], [place], [place], [place], [address] Phone: [phone]  Fax: [phone]  Consultation  Referring Provider:     Dr Blaine Hamper  Primary Care Physician:  Pcp, No Primary Gastroenterologist:  Dr. Alice Reichert         Reason for Consultation:     Crohns disease   Date of Admission:  07/27/2020 Date of Consultation:  07/27/2020         HPI:   Brandon Montes is a 65 y.o. male who has been a patient of [facility] since 2017 , carries a diagnosis of crohns disease of the terminal ileum and has been on Aprisio since then . Last colonoscopy was back in 2019 by Dr Alice Reichert and his procedure note mentioned that it was unlikely she had Crohn disease and initial diagnosis was possibly and infection and Aprisio was discontinued. Terminal ileum was not intubated at that time.Biopsies of the colon showed no active evidence of colitis.    The patient presented today to the ER with abdominal pain of 12 hours duration , the nature of his pain was similar to the episode of small bowel obstruction he had back in 2017 . CT scan was performed , Dr Dahlia Byes consulted . He says that between 2017- today has had no GI issues. Not passing much gas presently , no nausea or vomiting , He has been taken Meloxicam for 3 months on a daily bassis for foot pain. Some abdominal distension Denies any other complaints.    In 2017 colonoscopy : Ileum was normal but there was inflammation of the left side of the colon . Ileal biopsies were normal , left colon biopsies showed active colitis but nothing Chronic.   In 2017 CT abdomen showed small bowel obstruction with transition zone in the RLQ . Possible thickening of the terminal ileum, was noted. A subsequent small bowel series was normal.   CT abdomen today on 07/27/2020 shows Distal small bowel obstruction, with transition point in lateral right abdomen suspicious for an adhesion.  Mild wall thickening  and mucosal enhancement of nondilated ileum distal to the transition point, suspicious for mild/chronic enteritis such as Crohn disease.   On admission Lbs : Lipase , CMPnormal  . WCC 12.8 .  Past Medical History:  Diagnosis Date  . Fatty liver   . Gall stones     Past Surgical History:  Procedure Laterality Date  . COLONOSCOPY WITH PROPOFOL N/A 05/04/2015   Procedure: COLONOSCOPY WITH PROPOFOL;  Surgeon: Lollie Sails, MD;  Location: [facility] ENDOSCOPY;  Service: Endoscopy;  Laterality: N/A;  . CYST EXCISION     from right wrist  . CYSTECTOMY    . TONSILLECTOMY    . VASECTOMY      Prior to Admission medications   Medication Sig Start Date End Date Taking? Authorizing Provider  meloxicam (MOBIC) 15 MG tablet Take 1 tablet (15 mg total) by mouth daily. 05/13/20  Yes McDonald, Stephan Minister, DPM    Family History  Problem Relation Age of Onset  . Diabetes Father   . Diabetes Paternal Grandfather      Social History   Tobacco Use  . Smoking status: Former    Pack years: 0.00  . Smokeless tobacco: Former  Media planner  . Vaping Use: Never used  Substance Use Topics  . Alcohol use: No    Alcohol/week: 0.0 standard drinks  . Drug use: No  Allergies as of 07/27/2020 - Review Complete 07/27/2020  Allergen Reaction Noted  . Shellfish-derived products Nausea And Vomiting 05/01/2015    Review of Systems:    All systems reviewed and negative except where noted in HPI.   Physical Exam:  Vital signs in last 24 hours: Temp:  [97.5 F (36.4 C)] 97.5 F (36.4 C) (07/04 0815) Pulse Rate:  [58-75] 65 (07/04 1500) Resp:  [20] 20 (07/04 0815) BP: (129-155)/(72-91) 129/72 (07/04 1500) SpO2:  [90 %-100 %] 97 % (07/04 1500) Weight:  [97.5 kg] 97.5 kg (07/04 0812)   General:   Pleasant, cooperative in NAD Head:  Normocephalic and atraumatic. Eyes:   No icterus.   Conjunctiva pink. PERRLA. Ears:  Normal auditory acuity. Neck:  Supple; no masses or thyroidomegaly Lungs: Respirations  even and unlabored. Lungs clear to auscultation bilaterally.   No wheezes, crackles, or rhonchi.  Heart:  Regular rate and rhythm;  Without murmur, clicks, rubs or gallops Abdomen:  Soft, nondistended, nontender. Normal bowel sounds. No appreciable masses or hepatomegaly.  No rebound or guarding.  Neurologic:  Alert and oriented x3;  grossly normal neurologically. Skin:  Intact without significant lesions or rashes. Cervical Nodes:  No significant cervical adenopathy. Psych:  Alert and cooperative. Normal affect.  LAB RESULTS: Recent Labs    07/27/20 0815  WBC 12.8*  HGB 16.4  HCT 50.2  PLT 202   BMET Recent Labs    07/27/20 0815  NA 137  K 4.6  CL 102  CO2 24  GLUCOSE 139*  BUN 25*  CREATININE 1.04  CALCIUM 9.7   LFT Recent Labs    07/27/20 0815  PROT 8.4*  ALBUMIN 4.8  AST 24  ALT 21  ALKPHOS 75  BILITOT 0.9   PT/INR Recent Labs    07/27/20 1224  LABPROT 14.7  INR 1.2    STUDIES: CT ABDOMEN PELVIS W CONTRAST  Result Date: 07/27/2020 CLINICAL DATA:  Paraumbilical abdominal pain and bloating beginning this morning. Previous history of small bowel obstruction. EXAM: CT ABDOMEN AND PELVIS WITH CONTRAST TECHNIQUE: Multidetector CT imaging of the abdomen and pelvis was performed using the standard protocol following bolus administration of intravenous contrast. CONTRAST:  167mL OMNIPAQUE IOHEXOL 300 MG/ML  SOLN COMPARISON:  04/14/2015 FINDINGS: Lower Chest: No acute findings. Hepatobiliary: No hepatic masses identified. A tiny calcified gallstone is noted, however there is no evidence of cholecystitis or biliary ductal dilatation. Pancreas:  No mass or inflammatory changes. Spleen: Within normal limits in size and appearance. Adrenals/Urinary Tract: No masses identified. A few tiny right renal cysts are again noted. No evidence of ureteral calculi or hydronephrosis. Stomach/Bowel: Moderate dilatation of small bowel loops is seen with fecal matter seen in distal small  bowel in the right abdomen. A transition point is seen in the lateral right abdomen in the mid to distal ileum, similar to prior study in 2017, likely due to adhesion. Nondilated loops of ileum distal of this point show mild wall thickening and mucosal enhancement, also similar to prior study in 2017, and suspicious for mild/chronic enteritis. Small amount of free fluid noted in the pelvis. No evidence of abscess. Normal appendix visualized. Vascular/Lymphatic: No pathologically enlarged lymph nodes. No acute vascular findings. Reproductive:  No mass or other significant abnormality. Other:  None. Musculoskeletal:  No suspicious bone lesions identified. IMPRESSION: Distal small bowel obstruction, with transition point in lateral right abdomen suspicious for an adhesion. Mild wall thickening and mucosal enhancement of nondilated ileum distal to the transition point, suspicious  for mild/chronic enteritis such as Crohn disease. Small amount of free fluid. No evidence of abscess. Cholelithiasis. No radiographic evidence of cholecystitis. Electronically Signed   By: Marlaine Hind M.D.   On: 07/27/2020 11:25   US Abdomen Limited RUQ (LIVER/GB)  Result Date: 07/27/2020 CLINICAL DATA:  Cholelithiasis.  Upper abdominal pain. EXAM: ULTRASOUND ABDOMEN LIMITED RIGHT UPPER QUADRANT COMPARISON:  CT scan earlier same day FINDINGS: Gallbladder: Shadowing gallstone measures 10 mm. Tiny cholesterol polyp identified in the gallbladder. No gallbladder wall thickening or pericholecystic fluid. The sonographer reports no sonographic Murphy sign. Common bile duct: Diameter: 6 mm Liver: Mild coarsening of hepatic echotexture suggests a component of underlying fatty deposition. Portal vein is patent on color Doppler imaging with normal direction of blood flow towards the liver. Other: None. IMPRESSION: 1. Cholelithiasis. No sonographic findings to suggest acute cholecystitis. 2. Question mild hepatic steatosis. Electronically Signed   By:  Misty Stanley M.D.   On: 07/27/2020 15:19      Impression / Plan:   JAKELL TRUSTY is a 65 y.o. y/o male with presents to the ER with 12 hours of peri umbilical pain similar to his presentation back in 2017 to the ER, CT scan of the abdomen shows features of small bowel obstruction , transition point in the RLQ , mild wall thickening of non dilated ileum distal to the transition seen suspicious for Crohns disease. Surprisingly a similar CT scan finding was seen back in [address]78 as well. A subsequent colonoscopy with ileal biopsies showed no abnormalities of the Ileum but showed only acute left sided colitis and no Chronic colitis. A subsequent colonoscopy in 2019 showed normal colon biopsies but the terminal ileum was not intubated .   Impression : Second episode of small bowel obstruction : Differentials include adhesions from chronic NSAID use vs small bowel Crohns disease.   Plan  Suggest conservative management with IV fluids , NPO , serial abdominal exams. Appreciate input from Dr Dahlia Byes .  From GI point of view suggest starting on IVCirpo and flagyl which would be treatment for Crohns as well an infectious enteritis. Once this episode has resolved , would recommend a MR enterography + colonoscopy with terminal ileal biopsies +/- capsule study of the small bowel to r/o crohns disease.  Obtain fecal calprotectin, baseline CRP. Avoid narcotics if possible .Stop any long term NSAID use.  Dr Bonna Gains will be on call from tomorrow, will follow and provide further recs.  If fails conservative management , I did explain then he may need surgery.   Thank you for involving me in the care of this patient.      LOS: 0 days   Brandon Bellows, MD  07/27/2020, 3:45 PM

## 2020-07-27 NOTE — H&P (Signed)
History and Physical    Brandon Montes:[id number] DOB: 12-16-1955 DOA: 07/27/2020  Referring MD/NP/PA:   PCP: Pcp, No   Patient coming from:  The patient is coming from home.  At baseline, pt is independent for most of ADL.        Chief Complaint: Abdominal pain   HPI: Brandon Montes is a 65 y.o. male with medical history significant of SBO, gallstone, gallbladder polyp, fatty liver, IBD, terminal ileitis without complication, who presents with abdominal pain.  Patient states that he has abdominal pain started last night, which is located in central abdomen, constant, 8 out of 10 in severity, sharp, nonradiating.  Patient has nausea, dry heaves, no fever or chills.  Last bowel movement was last night which was in small amount.  Denies chest pain, cough, shortness of breath.  No symptoms of UTI. Pt took home Gas-X without help.  ED Course: pt was found to have WBC 12.8, negative urinalysis, pending COVID PCR, electrolytes renal function okay, temperature 97.5, blood pressure 151/89, heart rate 73, 58, RR 20.  Patient is admitted to [facility] bed as inpatient.  Dr. Dahlia Byes of surgery and Dr. Vicente Males of GI are consulted.  CT-abd/pelvis: Distal small bowel obstruction, with transition point in lateral right abdomen suspicious for an adhesion.   Mild wall thickening and mucosal enhancement of nondilated ileum distal to the transition point, suspicious for mild/chronic enteritis such as Crohn disease.   Small amount of free fluid. No evidence of abscess.   Cholelithiasis. No radiographic evidence of cholecystitis.    Review of Systems:   General: no fevers, chills, no body weight gain, has poor appetite, has fatigue HEENT: no blurry vision, hearing changes or sore throat Respiratory: no dyspnea, coughing, wheezing CV: no chest pain, no palpitations GI: has nausea, heaves, abdominal pain, no diarrhea, constipation GU: no dysuria, burning on urination, increased urinary frequency, hematuria   Ext: no leg edema Neuro: no unilateral weakness, numbness, or tingling, no vision change or hearing loss Skin: no rash, no skin tear. MSK: No muscle spasm, no deformity, no limitation of range of movement in spin Heme: No easy bruising.  Travel history: No recent long distant travel.  Allergy:  Allergies  Allergen Reactions   Shellfish-Derived Products Nausea And Vomiting    Past Medical History:  Diagnosis Date   Fatty liver    Gall stones     Past Surgical History:  Procedure Laterality Date   COLONOSCOPY WITH PROPOFOL N/A 05/04/2015   Procedure: COLONOSCOPY WITH PROPOFOL;  Surgeon: Lollie Sails, MD;  Location: [facility] ENDOSCOPY;  Service: Endoscopy;  Laterality: N/A;   CYST EXCISION     from right wrist   CYSTECTOMY     TONSILLECTOMY     VASECTOMY      Social History:  reports that he has quit smoking. He has quit using smokeless tobacco. He reports that he does not drink alcohol and does not use drugs.  Family History:  Family History  Problem Relation Age of Onset   Diabetes Father    Diabetes Paternal Grandfather      Prior to Admission medications   Medication Sig Start Date End Date Taking? Authorizing Provider  meloxicam (MOBIC) 15 MG tablet Take 1 tablet (15 mg total) by mouth daily. 05/13/20   Criselda Peaches, DPM    Physical Exam: Vitals:   07/27/20 1030 07/27/20 1115 07/27/20 1130 07/27/20 1200  BP: (!) 148/87 (!) 151/89 (!) 155/91 (!) 146/88  Pulse: (!) 58 Marland Kitchen)  58 68 69  Resp:      Temp:      TempSrc:      SpO2: 100% 95% 99% 90%  Weight:      Height:       General: Not in acute distress HEENT:       Eyes: PERRL, EOMI, no scleral icterus.       ENT: No discharge from the ears and nose, no pharynx injection, no tonsillar enlargement.        Neck: No JVD, no bruit, no mass felt. Heme: No neck lymph node enlargement. Cardiac: S1/S2, RRR, No murmurs, No gallops or rubs. Respiratory: No rales, wheezing, rhonchi or rubs. GI: distended, diffused  tenderness, no rebound pain, no organomegaly, BS present. GU: No hematuria Ext: No pitting leg edema bilaterally. 1+DP/PT pulse bilaterally. Musculoskeletal: No joint deformities, No joint redness or warmth, no limitation of ROM in spin. Skin: No rashes.  Neuro: Alert, oriented X3, cranial nerves II-XII grossly intact, moves all extremities normally. Psych: Patient is not psychotic, no suicidal or hemocidal ideation.  Labs on Admission: I have personally reviewed following labs and imaging studies  CBC: Recent Labs  Lab 07/27/20 0815  WBC 12.8*  HGB 16.4  HCT 50.2  MCV 86.1  PLT 097   Basic Metabolic Panel: Recent Labs  Lab 07/27/20 0815  NA 137  K 4.6  CL 102  CO2 24  GLUCOSE 139*  BUN 25*  CREATININE 1.04  CALCIUM 9.7   GFR: Estimated Creatinine Clearance: 88 mL/min (by C-G formula based on SCr of 1.04 mg/dL). Liver Function Tests: Recent Labs  Lab 07/27/20 0815  AST 24  ALT 21  ALKPHOS 75  BILITOT 0.9  PROT 8.4*  ALBUMIN 4.8   Recent Labs  Lab 07/27/20 0815  LIPASE 31   No results for input(s): AMMONIA in the last 168 hours. Coagulation Profile: Recent Labs  Lab 07/27/20 1224  INR 1.2   Cardiac Enzymes: No results for input(s): CKTOTAL, CKMB, CKMBINDEX, TROPONINI in the last 168 hours. BNP (last 3 results) No results for input(s): PROBNP in the last 8760 hours. HbA1C: No results for input(s): HGBA1C in the last 72 hours. CBG: No results for input(s): GLUCAP in the last 168 hours. Lipid Profile: No results for input(s): CHOL, HDL, LDLCALC, TRIG, CHOLHDL, LDLDIRECT in the last 72 hours. Thyroid Function Tests: No results for input(s): TSH, T4TOTAL, FREET4, T3FREE, THYROIDAB in the last 72 hours. Anemia Panel: No results for input(s): VITAMINB12, FOLATE, FERRITIN, TIBC, IRON, RETICCTPCT in the last 72 hours. Urine analysis:    Component Value Date/Time   COLORURINE STRAW (A) 07/27/2020 1113   APPEARANCEUR CLEAR (A) 07/27/2020 1113   LABSPEC  1.026 07/27/2020 1113   PHURINE 5.0 07/27/2020 1113   GLUCOSEU NEGATIVE 07/27/2020 1113   HGBUR NEGATIVE 07/27/2020 1113   BILIRUBINUR NEGATIVE 07/27/2020 1113   KETONESUR NEGATIVE 07/27/2020 1113   PROTEINUR NEGATIVE 07/27/2020 1113   NITRITE NEGATIVE 07/27/2020 1113   LEUKOCYTESUR NEGATIVE 07/27/2020 1113   Sepsis Labs: @LABRCNTIP (procalcitonin:4,lacticidven:4) )No results found for this or any previous visit (from the past 240 hour(s)).   Radiological Exams on Admission: CT ABDOMEN PELVIS W CONTRAST  Result Date: 07/27/2020 CLINICAL DATA:  Paraumbilical abdominal pain and bloating beginning this morning. Previous history of small bowel obstruction. EXAM: CT ABDOMEN AND PELVIS WITH CONTRAST TECHNIQUE: Multidetector CT imaging of the abdomen and pelvis was performed using the standard protocol following bolus administration of intravenous contrast. CONTRAST:  112mL OMNIPAQUE IOHEXOL 300 MG/ML  SOLN  COMPARISON:  04/14/2015 FINDINGS: Lower Chest: No acute findings. Hepatobiliary: No hepatic masses identified. A tiny calcified gallstone is noted, however there is no evidence of cholecystitis or biliary ductal dilatation. Pancreas:  No mass or inflammatory changes. Spleen: Within normal limits in size and appearance. Adrenals/Urinary Tract: No masses identified. A few tiny right renal cysts are again noted. No evidence of ureteral calculi or hydronephrosis. Stomach/Bowel: Moderate dilatation of small bowel loops is seen with fecal matter seen in distal small bowel in the right abdomen. A transition point is seen in the lateral right abdomen in the mid to distal ileum, similar to prior study in 2017, likely due to adhesion. Nondilated loops of ileum distal of this point show mild wall thickening and mucosal enhancement, also similar to prior study in 2017, and suspicious for mild/chronic enteritis. Small amount of free fluid noted in the pelvis. No evidence of abscess. Normal appendix visualized.  Vascular/Lymphatic: No pathologically enlarged lymph nodes. No acute vascular findings. Reproductive:  No mass or other significant abnormality. Other:  None. Musculoskeletal:  No suspicious bone lesions identified. IMPRESSION: Distal small bowel obstruction, with transition point in lateral right abdomen suspicious for an adhesion. Mild wall thickening and mucosal enhancement of nondilated ileum distal to the transition point, suspicious for mild/chronic enteritis such as Crohn disease. Small amount of free fluid. No evidence of abscess. Cholelithiasis. No radiographic evidence of cholecystitis. Electronically Signed   By: Marlaine Hind M.D.   On: 07/27/2020 11:25     EKG: I have personally reviewed.  Sinus rhythm, QTC 419, borderline LAD, no ischemic change  Assessment/Plan Principal Problem:   SBO (small bowel obstruction) (HCC) Active Problems:   Leukocytosis   SBO (small bowel obstruction) ([facility]): CT scan showed distal small bowel obstruction, with transition point in lateral right abdomen suspicious for an adhesion, also showed mild wall thickening and mucosal enhancement of nondilated ileum distal to the transition point, suspicious for mild/chronic enteritis such as Crohn disease. Dr. Dahlia Byes of surgery and Dr. Vicente Males of GI are consulted. Dr. Vicente Males recommended to consider IV Cipro and Flagyl.  I will start IV zosyn due to less side effects.  Patient has mild leukocytosis with WBC 20.8.  No fever.  Does not have sepsis.  Patient is not actively vomiting, will not start NG tube now.  -Admitted to [facility] bed as inpatient -N.p.o. -IVF: 1L of LR, then 75 cc/h -As needed morphine and Zofran -IV zosyn  Leukocytosis: no fever, not septic. -on zosyn as above         DVT ppx: SCD Code Status: Full code Family Communication:  Yes, patient's wife  at bed side Disposition Plan:  Anticipate discharge back to previous environment Consults called:  Dr. Dahlia Byes of surgery and Dr. Vicente Males of GI are  consulted. Admission status and Level of care: Med-Surg:    Med-surg bed as inpt     Status is: Inpatient  Remains inpatient appropriate because:Inpatient level of care appropriate due to severity of illness  Dispo: The patient is from: Home              Anticipated d/c is to: Home              Patient currently is not medically stable to d/c.   Difficult to place patient No          Date of Service 07/27/2020    Ivor Costa [facility]   If 7PM-7AM, please contact night-coverage [email or web address] 07/27/2020, 1:08 PM

## 2020-07-27 NOTE — ED Provider Notes (Signed)
[facility] Emergency Department Provider Note ____________________________________________   Event Date/Time   First MD Initiated Contact with Patient 07/27/20 [phone]     (approximate)  I have reviewed the triage vital signs and the nursing notes.  HISTORY  Chief Complaint Abdominal Pain   HPI Brandon Montes is a 65 y.o. malewho presents to the ED for evaluation of abd pain.   Chart review indicates hx 2017 SBO managed medically.  No history of intra-abdominal surgeries.  History of gallstones.  Patient presents to the ED today for evaluation of about 12 hours of intermittent abdominal pain, bloating, nausea and constipation.  Reports pain starting last night before going to bed around 10 PM.  He reports increasing pain since that time, despite taking home Gas-X, trying to pass bowel movements.  He reports nonradiating periumbilical abdominal pain, reminiscent of his previous episodes of SBO, as well as concerns for dehydration due to poor intake and nonbloody nonbilious emesis.   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   Gallbladder polyp 03/31/2015   Hematuria 01/14/2009   Shoulder joint pain 03/17/2008   Pain in soft tissues of limb 10/02/2006   Routine history and physical examination of adult 08/11/2005    Past Surgical History:  Procedure Laterality Date   COLONOSCOPY WITH PROPOFOL N/A 05/04/2015   Procedure: COLONOSCOPY WITH  PROPOFOL;  Surgeon: Lollie Sails, MD;  Location: [facility] ENDOSCOPY;  Service: Endoscopy;  Laterality: N/A;   CYST EXCISION     from right wrist   CYSTECTOMY     TONSILLECTOMY     VASECTOMY      Prior to Admission medications   Medication Sig Start Date End Date Taking? Authorizing Provider  meloxicam (MOBIC) 15 MG tablet Take 1 tablet (15 mg total) by mouth daily. 05/13/20   McDonald, Stephan Minister, DPM    Allergies Shellfish-derived products  Family History  Problem Relation Age of Onset   Diabetes Father    Diabetes Paternal Grandfather     Social History Social History   Tobacco Use   Smoking status: Former    Pack years: 0.00   Smokeless tobacco: Former  Scientific laboratory technician Use: Never used  Substance Use Topics   Alcohol use: No    Alcohol/week: 0.0 standard drinks   Drug use: No    Review of Systems  Constitutional: No fever/chills Eyes: No visual changes. ENT: No sore throat. Cardiovascular: Denies chest pain. Respiratory: Denies shortness of breath. Gastrointestinal: Positive for abdominal pain, nausea, vomiting and constipation   No diarrhea.   Genitourinary: Negative for dysuria. Musculoskeletal: Negative for back pain. Skin: Negative for rash. Neurological: Negative for headaches, focal weakness or numbness.  ____________________________________________   PHYSICAL EXAM:  VITAL SIGNS: Vitals:   07/27/20 1030 07/27/20 1115  BP: (!) 148/87 (!) 151/89  Pulse: (!) 58 (!) 58  Resp:    Temp:    SpO2: 100% 95%     Constitutional: Alert and oriented. Well appearing  and in no acute distress. Eyes: Conjunctivae are normal. PERRL. EOMI. Head: Atraumatic. Nose: No congestion/rhinnorhea. Mouth/Throat: Mucous membranes are moist.  Oropharynx non-erythematous. Neck: No stridor. No cervical spine tenderness to palpation. Cardiovascular: Normal rate, regular rhythm. Grossly normal heart sounds.  Good peripheral circulation. Respiratory: Normal respiratory  effort.  No retractions. Lungs CTAB. Gastrointestinal: Soft , mild distention.  Minimal diffuse pain, more localized to the periumbilical abdomen.  No peritoneal features.  No CVA tenderness. Musculoskeletal: No lower extremity tenderness nor edema.  No joint effusions. No signs of acute trauma. Neurologic:  Normal speech and language. No gross focal neurologic deficits are appreciated. No gait instability noted. Skin:  Skin is warm, dry and intact. No rash noted. Psychiatric: Mood and affect are normal. Speech and behavior are normal.  ____________________________________________   LABS (all labs ordered are listed, but only abnormal results are displayed)  Labs Reviewed  COMPREHENSIVE METABOLIC PANEL - Abnormal; Notable for the following components:      Result Value   Glucose, Bld 139 (*)    BUN 25 (*)    Total Protein 8.4 (*)    All other components within normal limits  CBC - Abnormal; Notable for the following components:   WBC 12.8 (*)    RBC 5.83 (*)    All other components within normal limits  URINALYSIS, COMPLETE (UACMP) WITH MICROSCOPIC - Abnormal; Notable for the following components:   Color, Urine STRAW (*)    APPearance CLEAR (*)    All other components within normal limits  SARS CORONAVIRUS 2 (TAT 6-24 HRS)  LIPASE, BLOOD   ____________________________________________  12 Lead EKG  Sinus rhythm, rate of 75 bpm.  Normal axis and intervals.  No evidence of acute ischemia. ____________________________________________  RADIOLOGY  ED MD interpretation: CT reviewed by me with distal SBO  Official radiology report(s): CT ABDOMEN PELVIS W CONTRAST  Result Date: 07/27/2020 CLINICAL DATA:  Paraumbilical abdominal pain and bloating beginning this morning. Previous history of small bowel obstruction. EXAM: CT ABDOMEN AND PELVIS WITH CONTRAST TECHNIQUE: Multidetector CT imaging of the abdomen and pelvis was performed using the standard protocol following bolus  administration of intravenous contrast. CONTRAST:  187mL OMNIPAQUE IOHEXOL 300 MG/ML  SOLN COMPARISON:  04/14/2015 FINDINGS: Lower Chest: No acute findings. Hepatobiliary: No hepatic masses identified. A tiny calcified gallstone is noted, however there is no evidence of cholecystitis or biliary ductal dilatation. Pancreas:  No mass or inflammatory changes. Spleen: Within normal limits in size and appearance. Adrenals/Urinary Tract: No masses identified. A few tiny right renal cysts are again noted. No evidence of ureteral calculi or hydronephrosis. Stomach/Bowel: Moderate dilatation of small bowel loops is seen with fecal matter seen in distal small bowel in the right abdomen. A transition point is seen in the lateral right abdomen in the mid to distal ileum, similar to prior study in 2017, likely due to adhesion. Nondilated loops of ileum distal of this point show mild wall thickening and mucosal enhancement, also similar to prior study in 2017, and suspicious for mild/chronic enteritis. Small amount of free fluid noted in the pelvis. No evidence of abscess. Normal appendix visualized. Vascular/Lymphatic: No pathologically enlarged lymph nodes. No acute vascular findings. Reproductive:  No mass or other significant abnormality. Other:  None. Musculoskeletal:  No suspicious bone lesions identified. IMPRESSION: Distal small bowel obstruction, with transition point in lateral right abdomen suspicious for an adhesion. Mild wall thickening and mucosal enhancement of nondilated ileum distal to the transition point, suspicious for mild/chronic enteritis such as  Crohn disease. Small amount of free fluid. No evidence of abscess. Cholelithiasis. No radiographic evidence of cholecystitis. Electronically Signed   By: Marlaine Hind M.D.   On: 07/27/2020 11:25    ____________________________________________   PROCEDURES and INTERVENTIONS  Procedure(s) performed (including Critical Care):  .1-3 Lead EKG  Interpretation  Date/Time: 07/27/2020 11:37 AM Performed by: Vladimir Crofts, MD Authorized by: Vladimir Crofts, MD     Interpretation: normal     ECG rate:  62   ECG rate assessment: normal     Rhythm: sinus rhythm     Ectopy: none     Conduction: normal    Medications  lactated ringers bolus 1,000 mL (0 mLs Intravenous Stopped 07/27/20 1108)  morphine 4 MG/ML injection 4 mg (4 mg Intravenous Given 07/27/20 0933)  ondansetron (ZOFRAN) injection 4 mg (4 mg Intravenous Given 07/27/20 0931)  iohexol (OMNIPAQUE) 9 MG/ML oral solution 1,000 mL (1,000 mLs Oral Contrast Given 07/27/20 0954)  iohexol (OMNIPAQUE) 300 MG/ML solution 100 mL (100 mLs Intravenous Contrast Given 07/27/20 1052)    ____________________________________________   MDM / ED COURSE   65 year old male presents to the ED with evidence of acute SBO requiring medical admission.  Exam with mild distention and tenderness without peritoneal features.  He clinically looks well.  Blood work is unremarkable and urine is without infectious features.  CT imaging with oral and IV contrast demonstrates SBO without evidence of perforation.  No recurrent emesis here and his pain is well controlled with morphine.  No current indications for NG tube.  We will discuss the case with hospitalist for admission.  Clinical Course as of 07/27/20 1137  Mon Jul 27, 2020  1027 Reassessed.  Patient reports feeling better after morphine.  Drinking oral contrast.  Wife now at the bedside. [DS]    Clinical Course User Index [DS] Vladimir Crofts, MD    ____________________________________________   FINAL CLINICAL IMPRESSION(S) / ED DIAGNOSES  Final diagnoses:  None     ED Discharge Orders     None        Clent Damore   Note:  This document was prepared using Dragon voice recognition software and may include unintentional dictation errors.    Vladimir Crofts, MD 07/27/20 [phone]

## 2020-07-27 NOTE — Consult Note (Signed)
Patient ID: Brandon Montes, male   DOB: 1956/01/09, 65 y.o.   MRN: [id number]  HPI Brandon Montes is a 65 y.o. male seen in consultation at the request of Dr Blaine Hamper.  He presented early this morning to the emergency room complaining of abdominal pain.  Patient states that the pain started last night.  Moderate to severe in intensity intermittent and colicky type.  He did have associated nausea and some dry heaving.  No fevers no chills.  Patient has never had an abdominal operations in the past.  Apparently at some point in time he was told he had Crohn's disease and was seen by Dr. Gustavo Lah from [facility].  He apparently had a similar episode a few years back.  Was subsequently seen by [facility] who did not believe symptoms were related to Crohn's.  He underwent a CT scan that have personally reviewed showing evidence of small bowel dilation.  There is a transition area near the terminal ileum with decompressed small bowel.  There is evidence of terminal ileitis without an abscess without evidence of perforation.  There is no evidence of internal hernias and there is no evidence of bowel compromise pneumoperitoneum or pneumatosis.  Please note that he had a similar CT scan from 5 years ago.  He did have a CMP that was completely normal other than mild elevation of BUN.  CBC shows mildly elevated white count of 12.8.  Rest of his labs are within normal limits.  HPI  Past Medical History:  Diagnosis Date   Fatty liver    Gall stones     Past Surgical History:  Procedure Laterality Date   COLONOSCOPY WITH PROPOFOL N/A 05/04/2015   Procedure: COLONOSCOPY WITH PROPOFOL;  Surgeon: Lollie Sails, MD;  Location: [facility] ENDOSCOPY;  Service: Endoscopy;  Laterality: N/A;   CYST EXCISION     from right wrist   CYSTECTOMY     TONSILLECTOMY     VASECTOMY      Family History  Problem Relation Age of Onset   Diabetes Father    Diabetes Paternal Grandfather     Social History Social History   Tobacco Use    Smoking status: Former    Pack years: 0.00   Smokeless tobacco: Former  Scientific laboratory technician Use: Never used  Substance Use Topics   Alcohol use: No    Alcohol/week: 0.0 standard drinks   Drug use: No    Allergies  Allergen Reactions   Shellfish-Derived Products Nausea And Vomiting    Current Facility-Administered Medications  Medication Dose Route Frequency Provider Last Rate Last Admin   acetaminophen (TYLENOL) tablet 650 mg  650 mg Oral Q6H PRN Ivor Costa, MD       lactated ringers infusion   Intravenous Continuous Ivor Costa, MD 75 mL/hr at 07/27/20 1204 New Bag at 07/27/20 1204   morphine 2 MG/ML injection 2 mg  2 mg Intravenous Q4H PRN Ivor Costa, MD       ondansetron (ZOFRAN) injection 4 mg  4 mg Intravenous Q8H PRN Ivor Costa, MD       Current Outpatient Medications  Medication Sig Dispense Refill   meloxicam (MOBIC) 15 MG tablet Take 1 tablet (15 mg total) by mouth daily. 30 tablet 3     Review of Systems Full ROS  was asked and was negative except for the information on the HPI  Physical Exam Blood pressure (!) 146/88, pulse 69, temperature (!) 97.5 F (36.4 C), temperature source Oral,  resp. rate 20, height 6' (1.829 m), weight 97.5 kg, SpO2 90 %. CONSTITUTIONAL: NAD EYES: Pupils are equal, round, aSclera are non-icteric. EARS, NOSE, MOUTH AND THROAT: THe is wearing a mask. Hearing is intact to voice. LYMPH NODES:  Lymph nodes in the neck are normal. RESPIRATORY:  Lungs are clear. There is normal respiratory effort, with equal breath sounds bilaterally, and without pathologic use of accessory muscles. CARDIOVASCULAR: Heart is regular without murmurs, gallops, or rubs. GI: The abdomen is  soft, mildly distended. Non tender. No peritonitis or rebound.  There are no palpable masses. There is no hepatosplenomegaly. There are decrease bowel sounds. Reducible [facility]  GU: Rectal deferred.   MUSCULOSKELETAL: Normal muscle strength and tone. No cyanosis or edema.   SKIN:  Turgor is good and there are no pathologic skin lesions or ulcers. NEUROLOGIC: Motor and sensation is grossly normal. Cranial nerves are grossly intact. PSYCH:  Oriented to person, place and time. Affect is normal.  Data Reviewed  I have personally reviewed the patient's imaging, laboratory findings and medical records.    Assessment/Plan 65 year old male presented with abdominal pain and nausea w findings suspicious for partial small bowel obstruction versus Crohn's versus enteritis due to infectious process versus NSAIDs.  At this point in time he does not have an acute abdomen.  We have consulted GI who will assess him for potential other causes.  We will perform serial abdominal exams.  At this time I will hold off on surgical intervention until we have a little bit more clear view of his etiology for his symptoms.  He understands that he may require operative interventions if the symptoms persist or do not improve.  I will perform an x-ray in the morning and continue serial abdominal exams.    Caroleen Hamman, MD FACS General Surgeon 07/27/2020, 1:37 PM

## 2020-07-27 NOTE — ED Notes (Signed)
Bill RN aware of assigned bed 

## 2020-07-28 ENCOUNTER — Inpatient Hospital Stay: Payer: BC Managed Care – PPO

## 2020-07-28 DIAGNOSIS — R1084 Generalized abdominal pain: Secondary | ICD-10-CM | POA: Diagnosis not present

## 2020-07-28 DIAGNOSIS — R110 Nausea: Secondary | ICD-10-CM | POA: Diagnosis not present

## 2020-07-28 DIAGNOSIS — T39395A Adverse effect of other nonsteroidal anti-inflammatory drugs [NSAID], initial encounter: Secondary | ICD-10-CM | POA: Diagnosis not present

## 2020-07-28 DIAGNOSIS — K639 Disease of intestine, unspecified: Secondary | ICD-10-CM

## 2020-07-28 DIAGNOSIS — K5000 Crohn's disease of small intestine without complications: Secondary | ICD-10-CM

## 2020-07-28 DIAGNOSIS — K56609 Unspecified intestinal obstruction, unspecified as to partial versus complete obstruction: Secondary | ICD-10-CM

## 2020-07-28 LAB — COMPREHENSIVE METABOLIC PANEL
ALT: 14 U/L (ref 0–44)
AST: 18 U/L (ref 15–41)
Albumin: 3.6 g/dL (ref 3.5–5.0)
Alkaline Phosphatase: 56 U/L (ref 38–126)
Anion gap: 8 (ref 5–15)
BUN: 25 mg/dL — ABNORMAL HIGH (ref 8–23)
CO2: 28 mmol/L (ref 22–32)
Calcium: 8.4 mg/dL — ABNORMAL LOW (ref 8.9–10.3)
Chloride: 102 mmol/L (ref 98–111)
Creatinine, Ser: 1.02 mg/dL (ref 0.61–1.24)
GFR, Estimated: >60 mL/min (ref >60)
Glucose, Bld: 103 mg/dL — ABNORMAL HIGH (ref 70–99)
Potassium: 4.2 mmol/L (ref 3.5–5.1)
Sodium: 138 mmol/L (ref 135–145)
Total Bilirubin: 1.2 mg/dL (ref 0.3–1.2)
Total Protein: 6.5 g/dL (ref 6.5–8.1)

## 2020-07-28 LAB — CBC
HCT: 44.5 % (ref 39.0–52.0)
Hemoglobin: 14.2 g/dL (ref 13.0–17.0)
MCH: 27.7 pg (ref 26.0–34.0)
MCHC: 31.9 g/dL (ref 30.0–36.0)
MCV: 86.7 fL (ref 80.0–100.0)
Platelets: 210 10*3/uL (ref 150–400)
RBC: 5.13 MIL/uL (ref 4.22–5.81)
RDW: 14.2 % (ref 11.5–15.5)
WBC: 9.3 10*3/uL (ref 4.0–10.5)
nRBC: 0 % (ref 0.0–0.2)

## 2020-07-28 LAB — MAGNESIUM: Magnesium: 2 mg/dL (ref 1.7–2.4)

## 2020-07-28 LAB — HIV ANTIBODY (ROUTINE TESTING W REFLEX): HIV Screen 4th Generation wRfx: NONREACTIVE

## 2020-07-28 MED ORDER — BOOST / RESOURCE BREEZE PO LIQD CUSTOM
1.0000 | Freq: Three times a day (TID) | ORAL | Status: DC
  Administered 2020-07-28 – 2020-07-29 (×3): 1 via ORAL

## 2020-07-28 MED ORDER — ADULT MULTIVITAMIN W/MINERALS CH
1.0000 | ORAL_TABLET | Freq: Every day | ORAL | Status: DC
  Administered 2020-07-29: 1 via ORAL
  Filled 2020-07-28: qty 1

## 2020-07-28 NOTE — Progress Notes (Signed)
1       [place] [facility] [place] NAME: Brandon Montes    MR#:  [id number]  Pcp, No  DATE OF BIRTH:  02-15-1955  SUBJECTIVE:  CHIEF COMPLAINT:   Chief Complaint  Patient presents with  . Abdominal Pain  Patient denies any pain at this time.  Tolerating clear liquid diet thus far. REVIEW OF SYSTEMS:  Review of Systems  Constitutional:  Negative for diaphoresis, fever, malaise/fatigue and weight loss.  HENT:  Negative for ear discharge, ear pain, hearing loss, nosebleeds, sore throat and tinnitus.   Eyes:  Negative for blurred vision and pain.  Respiratory:  Negative for cough, hemoptysis, shortness of breath and wheezing.   Cardiovascular:  Negative for chest pain, palpitations, orthopnea and leg swelling.  Gastrointestinal:  Negative for abdominal pain, blood in stool, constipation, diarrhea, heartburn, nausea and vomiting.  Genitourinary:  Negative for dysuria, frequency and urgency.  Musculoskeletal:  Negative for back pain and myalgias.  Skin:  Negative for itching and rash.  Neurological:  Negative for dizziness, tingling, tremors, focal weakness, seizures, weakness and headaches.  Psychiatric/Behavioral:  Negative for depression. The patient is not nervous/anxious.   DRUG ALLERGIES:   Allergies  Allergen Reactions  . Shellfish-Derived Products Nausea And Vomiting   VITALS:  Blood pressure 117/67, pulse 66, temperature 98.1 F (36.7 C), resp. rate 16, height 6' (1.829 m), weight 98.9 kg, SpO2 100 %. PHYSICAL EXAMINATION:  Physical Exam 65 year old male lying in the bed comfortably without any acute distress Eyes pupils equal round reactive to light and accommodation Lungs clear to auscultation bilaterally no wheezing rales rhonchi or crepitation Cardiovascular S1-S2 normal no murmur rales or gallop Abdomen soft, benign Neuro alert, nonfocal Skin no rash or lesion LABORATORY PANEL:  Male CBC Recent Labs  Lab 07/28/20 0517  WBC 9.3  HGB 14.2   HCT 44.5  PLT 210   ------------------------------------------------------------------------------------------------------------------ Chemistries  Recent Labs  Lab 07/28/20 0517  NA 138  K 4.2  CL 102  CO2 28  GLUCOSE 103*  BUN 25*  CREATININE 1.02  CALCIUM 8.4*  MG 2.0  AST 18  ALT 14  ALKPHOS 56  BILITOT 1.2   RADIOLOGY:  DG ABD ACUTE 2+V W 1V CHEST  Result Date: 07/28/2020 CLINICAL DATA:  Pain.  Vomiting. EXAM: DG ABDOMEN ACUTE WITH 1 VIEW CHEST COMPARISON:  CT 07/27/2020. FINDINGS: Soft tissue structures are unremarkable. Several loops of dilated bowel, most likely small bowel again noted. Bowel wall thickening is also again noted. These findings suggest the possibility of an active bowel process such as enteritis. Associated small bowel obstruction/partial obstruction cannot be excluded. Oral contrast from prior CT noted in the colon. No free air. Left base subsegmental atelectasis. Degenerative change lumbar spine. IMPRESSION: 1. Several loops of dilated bowel, most likely small bowel again noted. Bowel wall thickening is also again noted. These findings suggest possibly of an active bowel process such as enteritis. Associated small bowel obstruction/partial small bowel obstruction cannot be excluded. Oral contrast from prior CT is noted in the colon. 2.  Mild left base atelectasis. Electronically Signed   By: Marcello Moores  Register   On: 07/28/2020 07:42   ASSESSMENT AND PLAN:  65 y.o. male with medical history significant of SBO, gallstone, gallbladder polyp, fatty liver, IBD, terminal ileitis without complication is admitted for abdominal pain  Principal Problem:   SBO (small bowel obstruction) (HCC) Active Problems:   Leukocytosis  Partial small bowel obstruction/ileus/enteritis Could be from frequent use of NSAIDs  GI not planning any luminal evaluation during this admission Outpatient MRI and colonoscopy, capsule study per GI -Appreciate GI and surgery input.  Clear  liquid diet started today and has been tolerating thus far Will advance to full liquid later today with a plan for soft diet tomorrow if he does well Progressive mobility No notes on file  Body mass index is 29.58 kg/m.  Net IO Since Admission: 332.5 mL [07/28/20 1534]      Status is: Inpatient  Remains inpatient appropriate because:Ongoing diagnostic testing needed not appropriate for outpatient work up needs to wait for diet tolerance to make sure his GI issue does not require  Dispo: The patient is from: Home              Anticipated d/c is to: Home              Patient currently is not medically stable to d/c.   Difficult to place patient No    DVT prophylaxis:       SCDs Start: 07/27/20 1852     Family Communication: "discussed with patient"   All the records are reviewed and case discussed with Care Management/Social Worker. Management plans discussed with the patient, nursing and they are in agreement.  CODE STATUS: Full Code Level of care: Med-Surg  TOTAL TIME TAKING CARE OF THIS PATIENT: 35 minutes.   More than 50% of the time was spent in counseling/coordination of care: YES  POSSIBLE D/C IN 1-2 DAYS, DEPENDING ON CLINICAL CONDITION.   Max Sane M.D on 07/28/2020 at 3:34 PM  [facility]   CC: Primary care physician; Pcp, No  Note: This dictation was prepared with Dragon dictation along with smaller phrase technology. Any transcriptional errors that result from this process are unintentional.

## 2020-07-28 NOTE — Progress Notes (Addendum)
Brandon Antigua, MD [address], [place], [place], [place], [address] [address], [place], [place], [place], [address] Phone: [phone]  Fax: [phone]   Subjective: Patient sitting in bed comfortably.  States he feels much better than when he came in.  No nausea or vomiting.  Reports flatus.  No bowel movements.  No fever or chills. Tolerating oral diet   Objective: Exam: Vital signs in last 24 hours: Vitals:   07/27/20 1853 07/27/20 2300 07/28/20 0505 07/28/20 0831  BP: 139/81 140/75 126/76 123/74  Pulse: 70 73 75 60  Resp: 20 20 20 19   Temp: (!) 97.4 F (36.3 C) 98.8 F (37.1 C) 98.2 F (36.8 C) 97.8 F (36.6 C)  TempSrc:  Oral Oral Oral  SpO2: 97% 95% 95% 99%  Weight:  98.9 kg    Height:  6' (1.829 m)     Weight change:   Intake/Output Summary (Last 24 hours) at 07/28/2020 1032 Last data filed at 07/27/2020 1108 Gross per 24 hour  Intake 1000 ml  Output --  Net 1000 ml    Constitutional: General:   Alert,  Well-developed, well-nourished, pleasant and cooperative in NAD BP 123/74 (BP Location: Right Arm)   Pulse 60   Temp 97.8 F (36.6 C) (Oral)   Resp 19   Ht 6' (1.829 m)   Wt 98.9 kg   SpO2 99%   BMI 29.58 kg/m   Eyes:  Sclera clear, no icterus.   Conjunctiva pink. PERRLA  Ears:  No scars, lesions or masses, Normal auditory acuity. Nose:  No deformity, discharge, or lesions. Mouth:  No deformity or lesions, oropharynx pink & moist.  Neck:  Supple; no masses or thyromegaly.  Respiratory: Normal respiratory effort, Normal percussion  Gastrointestinal:  Normal bowel sounds.  No bruits.  Soft, non-tender and non-distended without masses, hepatosplenomegaly or hernias noted.  No guarding or rebound tenderness.     Cardiac: No clubbing or edema.  No cyanosis. Normal posterior tibial pedal pulses noted.  Lymphatic:  No significant cervical or axillary adenopathy.  Psych:  Alert and cooperative. Normal mood and affect.  Musculoskeletal:   Normal gait. Head normocephalic, atraumatic. Symmetrical without gross deformities. 5/5 Upper and Lower extremity strength bilaterally.  Skin: Warm. Intact without significant lesions or rashes. No jaundice.  Neurologic:  Face symmetrical, tongue midline, Normal sensation to touch;  grossly normal neurologically.  Psych:  Alert and oriented x3, Alert and cooperative. Normal mood and affect.   Lab Results: Lab Results  Component Value Date   WBC 9.3 07/28/2020   HGB 14.2 07/28/2020   HCT 44.5 07/28/2020   MCV 86.7 07/28/2020   PLT 210 07/28/2020   Micro Results: Recent Results (from the past 240 hour(s))  SARS CORONAVIRUS 2 (TAT 6-24 HRS) Nasopharyngeal Nasopharyngeal Swab     Status: None   Collection Time: 07/27/20  9:37 AM   Specimen: Nasopharyngeal Swab  Result Value Ref Range Status   SARS Coronavirus 2 NEGATIVE NEGATIVE Final    Comment: (NOTE) SARS-CoV-2 target nucleic acids are NOT DETECTED.  The SARS-CoV-2 RNA is generally detectable in upper and lower respiratory specimens during the acute phase of infection. Negative results do not preclude SARS-CoV-2 infection, do not rule out co-infections with other pathogens, and should not be used as the sole basis for treatment or other patient management decisions. Negative results must be combined with clinical observations, patient history, and epidemiological information. The expected result is Negative.  Fact Sheet for Patients: SugarRoll.be  Fact Sheet  for Healthcare Providers: https://www.woods-mathews.com/  This test is not yet approved or cleared by the [place] and  has been authorized for detection and/or diagnosis of SARS-CoV-2 by FDA under an Emergency Use Authorization (EUA). This EUA will remain  in effect (meaning this test can be used) for the duration of the COVID-19 declaration under Se ction 564(b)(1) of the Act, 21 U.S.C. section 360bbb-3(b)(1),  unless the authorization is terminated or revoked sooner.  Performed at [facility], [place] [address], [place], [place] [address]    Studies/Results: CT ABDOMEN PELVIS W CONTRAST  Result Date: 07/27/2020 CLINICAL DATA:  Paraumbilical abdominal pain and bloating beginning this morning. Previous history of small bowel obstruction. EXAM: CT ABDOMEN AND PELVIS WITH CONTRAST TECHNIQUE: Multidetector CT imaging of the abdomen and pelvis was performed using the standard protocol following bolus administration of intravenous contrast. CONTRAST:  147mL OMNIPAQUE IOHEXOL 300 MG/ML  SOLN COMPARISON:  04/14/2015 FINDINGS: Lower Chest: No acute findings. Hepatobiliary: No hepatic masses identified. A tiny calcified gallstone is noted, however there is no evidence of cholecystitis or biliary ductal dilatation. Pancreas:  No mass or inflammatory changes. Spleen: Within normal limits in size and appearance. Adrenals/Urinary Tract: No masses identified. A few tiny right renal cysts are again noted. No evidence of ureteral calculi or hydronephrosis. Stomach/Bowel: Moderate dilatation of small bowel loops is seen with fecal matter seen in distal small bowel in the right abdomen. A transition point is seen in the lateral right abdomen in the mid to distal ileum, similar to prior study in 2017, likely due to adhesion. Nondilated loops of ileum distal of this point show mild wall thickening and mucosal enhancement, also similar to prior study in 2017, and suspicious for mild/chronic enteritis. Small amount of free fluid noted in the pelvis. No evidence of abscess. Normal appendix visualized. Vascular/Lymphatic: No pathologically enlarged lymph nodes. No acute vascular findings. Reproductive:  No mass or other significant abnormality. Other:  None. Musculoskeletal:  No suspicious bone lesions identified. IMPRESSION: Distal small bowel obstruction, with transition point in lateral right abdomen suspicious for an adhesion.  Mild wall thickening and mucosal enhancement of nondilated ileum distal to the transition point, suspicious for mild/chronic enteritis such as Crohn disease. Small amount of free fluid. No evidence of abscess. Cholelithiasis. No radiographic evidence of cholecystitis. Electronically Signed   By: Marlaine Hind M.D.   On: 07/27/2020 11:25   DG ABD ACUTE 2+V W 1V CHEST  Result Date: 07/28/2020 CLINICAL DATA:  Pain.  Vomiting. EXAM: DG ABDOMEN ACUTE WITH 1 VIEW CHEST COMPARISON:  CT 07/27/2020. FINDINGS: Soft tissue structures are unremarkable. Several loops of dilated bowel, most likely small bowel again noted. Bowel wall thickening is also again noted. These findings suggest the possibility of an active bowel process such as enteritis. Associated small bowel obstruction/partial obstruction cannot be excluded. Oral contrast from prior CT noted in the colon. No free air. Left base subsegmental atelectasis. Degenerative change lumbar spine. IMPRESSION: 1. Several loops of dilated bowel, most likely small bowel again noted. Bowel wall thickening is also again noted. These findings suggest possibly of an active bowel process such as enteritis. Associated small bowel obstruction/partial small bowel obstruction cannot be excluded. Oral contrast from prior CT is noted in the colon. 2.  Mild left base atelectasis. Electronically Signed   By: Marcello Moores  Register   On: 07/28/2020 07:42   US Abdomen Limited RUQ (LIVER/GB)  Result Date: 07/27/2020 CLINICAL DATA:  Cholelithiasis.  Upper abdominal pain. EXAM: ULTRASOUND ABDOMEN  LIMITED RIGHT UPPER QUADRANT COMPARISON:  CT scan earlier same day FINDINGS: Gallbladder: Shadowing gallstone measures 10 mm. Tiny cholesterol polyp identified in the gallbladder. No gallbladder wall thickening or pericholecystic fluid. The sonographer reports no sonographic Murphy sign. Common bile duct: Diameter: 6 mm Liver: Mild coarsening of hepatic echotexture suggests a component of underlying fatty  deposition. Portal vein is patent on color Doppler imaging with normal direction of blood flow towards the liver. Other: None. IMPRESSION: 1. Cholelithiasis. No sonographic findings to suggest acute cholecystitis. 2. Question mild hepatic steatosis. Electronically Signed   By: Misty Stanley M.D.   On: 07/27/2020 15:19   Medications:  Scheduled Meds: Continuous Infusions:  lactated ringers 75 mL/hr at 07/27/20 1204   piperacillin-tazobactam (ZOSYN)  IV 3.375 g (07/28/20 0507)   PRN Meds:.acetaminophen, morphine injection, ondansetron (ZOFRAN) IV   Assessment: Principal Problem:   SBO (small bowel obstruction) (HCC) Active Problems:   Leukocytosis Abdominal pain Small bowel/ileal thickening   Plan: Patient has clinically improved on my evaluation today He reports being asymptomatic at this time, compared to yesterday or at presentation  Abdominal x-ray Images from today personally reviewed, and show dilated loops of bowel as noted in the above report  On further questioning today, patient does admit to meloxicam use daily for the last 2 months.  This may have contributed to his symptoms including ileal thickening as this can cause GI ulcerations.  Patient advised to refrain from frequent or heavy NSAID use and discuss alternatives for pain medications if needed with his PCP  Avoid NSAID use such as Ibuprofen, Aleeve, advil, motrin, BC and Goodie powder, Naproxen, Meloxicam and others.  If PCP or another provider prescribes aspirin for primary or secondary prevention, this may be okay to take and should be discussed by the prescribing physician.  Given resolution in symptoms at this time, do not recommend colonoscopy at this time.  MRE and colonoscopy once acute issues improve will help confirm if this acute episode was from self-limited episode from NSAID use, versus infectious etiology or if there is a chronic underlying process, which has not been confirmed on previous procedures  with biopsies.   LOS: 1 day   Brandon Antigua, MD 07/28/2020, 10:32 AM

## 2020-07-28 NOTE — Progress Notes (Addendum)
[facility] SURGICAL PROGRESS NOTE (cpt [id number])  Hospital Day(s): 1.    Interval History: Patient seen and examined, no acute events or new complaints overnight. Patient reports he is feeling much better this morning and is without any abdominal pain. He denies fever, chills, distension, nausea, emesis. His leukocytosis is resolved, now 9.3K. Renal function remains normal; sCr -1.02; UO - unmeasured. No electrolyte derangements. No reports of flatus.   Review of Systems:  Constitutional: denies fever, chills  HEENT: denies cough or congestion  Respiratory: denies any shortness of breath  Cardiovascular: denies chest pain or palpitations  Gastrointestinal: denies abdominal pain, N/V, or diarrhea/and bowel function as per interval history Genitourinary: denies burning with urination or urinary frequency  Vital signs in last 24 hours: [min-max] current  Temp:  [97.4 F (36.3 C)-98.8 F (37.1 C)] 98.2 F (36.8 C) (07/05 0505) Pulse Rate:  [58-81] 75 (07/05 0505) Resp:  [20] 20 (07/05 0505) BP: (126-155)/(72-91) 126/76 (07/05 0505) SpO2:  [85 %-100 %] 95 % (07/05 0505) Weight:  [97.5 kg-98.9 kg] 98.9 kg (07/04 2300)     Height: 6' (182.9 cm) Weight: 98.9 kg BMI (Calculated): 29.57   Intake/Output last 2 shifts:  07/04 0701 - 07/05 0700 In: 1000 [IV Piggyback:1000] Out: -    Physical Exam:  Constitutional: alert, cooperative and no distress  HENT: normocephalic without obvious abnormality  Eyes: PERRL, EOM's grossly intact and symmetric  Respiratory: breathing non-labored at rest  Cardiovascular: regular rate and sinus rhythm  Gastrointestinal: soft, non-tender, and non-distended, no rebound/guarding Musculoskeletal: no edema or wounds, motor and sensation grossly intact, NT    Labs:  CBC Latest Ref Rng & Units 07/28/2020 07/27/2020 08/09/2018  WBC 4.0 - 10.5 K/uL 9.3 12.8(H) 5.0  Hemoglobin 13.0 - 17.0 g/dL 14.2 16.4 14.2  Hematocrit 39.0 - 52.0 % 44.5 50.2 42.9   Platelets 150 - 400 K/uL 210 202 174   CMP Latest Ref Rng & Units 07/28/2020 07/27/2020 08/09/2018  Glucose 70 - 99 mg/dL 103(H) 139(H) 95  BUN 8 - 23 mg/dL 25(H) 25(H) 16  Creatinine 0.61 - 1.24 mg/dL 1.02 1.04 0.89  Sodium 135 - 145 mmol/L 138 137 137  Potassium 3.5 - 5.1 mmol/L 4.2 4.6 4.5  Chloride 98 - 111 mmol/L 102 102 98  CO2 22 - 32 mmol/L 28 24 23   Calcium 8.9 - 10.3 mg/dL 8.4(L) 9.7 9.5  Total Protein 6.5 - 8.1 g/dL 6.5 8.4(H) 6.9  Total Bilirubin 0.3 - 1.2 mg/dL 1.2 0.9 0.7  Alkaline Phos 38 - 126 U/L 56 75 61  AST 15 - 41 U/L 18 24 15   ALT 0 - 44 U/L 14 21 13      Imaging studies:   KUB + CXR (07/28/2020) personally reviewed with some loops of small bowel dilation but air and contrast seen throughout his colon, and radiologist report reviewed:  IMPRESSION: 1. Several loops of dilated bowel, most likely small bowel again noted. Bowel wall thickening is also again noted. These findings suggest possibly of an active bowel process such as enteritis. Associated small bowel obstruction/partial small bowel obstruction cannot be excluded. Oral contrast from prior CT is noted in the colon.   2.  Mild left base atelectasis.   Assessment/Plan: (ICD-10's: K58.609) 65 y.o. male with possible small bowel obstruction without any surgical history secondary to inflammatory changes at the level of the ileum concerning for possible Crohn's disease vs enteritis    - Given passage of contrast throughout his colon and that his symptoms  have resolved, I think it is reasonable to initiate CLD    - Appreciate GI input and assistance   - Monitor abdominal examination; on-going bowel function - Pain control prn (minimize narcotics); antiemetics prn - No surgical intervention warranted currently   - Mobilization as tolerated - Further management per primary service    All of the above findings and recommendations were discussed with the patient, and the medical team, and all of patient's  questions were answered to his expressed satisfaction.  -- Edison Simon, PA-C [facility] 07/28/2020, 7:11 AM [phone] M-F: 7am - 4pm  Patient seen and examined.  I agree with Mr. Freda Jackson. Improving. KUB w contrast in colon. ABdomen non tender. Start CLD. NO surgical intervention. May require surgery at some point in time in the future given SB finding but does not seem to require it during current hospitalization. Appreciate GI input

## 2020-07-28 NOTE — Progress Notes (Signed)
Initial Nutrition Assessment  DOCUMENTATION CODES:   Not applicable  INTERVENTION:   Boost Breeze po TID, each supplement provides 250 kcal and 9 grams of protein  MVI po daily   NUTRITION DIAGNOSIS:   Inadequate oral intake related to acute illness as evidenced by other (comment) (pt on clear liquid diet).  GOAL:   Patient will meet greater than or equal to 90% of their needs  MONITOR:   PO intake, Supplement acceptance, Labs, Weight trends, Skin, I & O's, Diet advancement  REASON FOR ASSESSMENT:   Malnutrition Screening Tool    ASSESSMENT:   65 y.o. male with medical history significant for recurrent SBO, gallstones, gallbladder polyp, fatty liver, IBD and terminal ileitis who is admitted with SBO.  RD working remotely.  Pt with abdominal pain that started the day of his admission. Pt initiated on clear liquids today. Pt reports that he is feeling better. RD will add supplements and MVI to help pt meet his estimated needs. Pt is being followed by GI and surgery. RD will follow up to obtain nutrition related history and exam at follow up.   Medications reviewed and include: LRS @75ml /hr, zosyn   Labs reviewed: K 4.2 wnl, BUN 25(H), Mg 2.0 wnl  NUTRITION - FOCUSED PHYSICAL EXAM: Unable to perform at this time   Diet Order:   Diet Order             Diet clear liquid Room service appropriate? Yes; Fluid consistency: Thin  Diet effective now                  EDUCATION NEEDS:   No education needs have been identified at this time  Skin:  Skin Assessment: Reviewed RN Assessment  Last BM:  PTA  Height:   Ht Readings from Last 1 Encounters:  07/27/20 6' (1.829 m)    Weight:   Wt Readings from Last 1 Encounters:  07/27/20 98.9 kg    Ideal Body Weight:  80.9 kg  BMI:  Body mass index is 29.58 kg/m.  Estimated Nutritional Needs:   Kcal:  2100-2400kcal/day  Protein:  105-120g/day  Fluid:  2.4-2.7L/day  Koleen Distance MS, RD, LDN Please  refer to [facility] for RD and/or RD on-call/weekend/after hours pager

## 2020-07-29 ENCOUNTER — Telehealth: Payer: Self-pay | Admitting: *Deleted

## 2020-07-29 ENCOUNTER — Inpatient Hospital Stay: Payer: BC Managed Care – PPO

## 2020-07-29 DIAGNOSIS — D72829 Elevated white blood cell count, unspecified: Secondary | ICD-10-CM

## 2020-07-29 DIAGNOSIS — K529 Noninfective gastroenteritis and colitis, unspecified: Secondary | ICD-10-CM | POA: Diagnosis not present

## 2020-07-29 DIAGNOSIS — R1084 Generalized abdominal pain: Secondary | ICD-10-CM | POA: Diagnosis not present

## 2020-07-29 DIAGNOSIS — R109 Unspecified abdominal pain: Secondary | ICD-10-CM

## 2020-07-29 DIAGNOSIS — K56609 Unspecified intestinal obstruction, unspecified as to partial versus complete obstruction: Secondary | ICD-10-CM | POA: Diagnosis not present

## 2020-07-29 DIAGNOSIS — R110 Nausea: Secondary | ICD-10-CM | POA: Diagnosis not present

## 2020-07-29 LAB — BASIC METABOLIC PANEL
Anion gap: 2 — ABNORMAL LOW (ref 5–15)
BUN: 15 mg/dL (ref 8–23)
CO2: 30 mmol/L (ref 22–32)
Calcium: 8.4 mg/dL — ABNORMAL LOW (ref 8.9–10.3)
Chloride: 107 mmol/L (ref 98–111)
Creatinine, Ser: 0.95 mg/dL (ref 0.61–1.24)
GFR, Estimated: >60 mL/min (ref >60)
Glucose, Bld: 99 mg/dL (ref 70–99)
Potassium: 4.3 mmol/L (ref 3.5–5.1)
Sodium: 139 mmol/L (ref 135–145)

## 2020-07-29 LAB — CBC
HCT: 42 % (ref 39.0–52.0)
Hemoglobin: 13.4 g/dL (ref 13.0–17.0)
MCH: 28.4 pg (ref 26.0–34.0)
MCHC: 31.9 g/dL (ref 30.0–36.0)
MCV: 89 fL (ref 80.0–100.0)
Platelets: 160 10*3/uL (ref 150–400)
RBC: 4.72 MIL/uL (ref 4.22–5.81)
RDW: 14.1 % (ref 11.5–15.5)
WBC: 6.7 10*3/uL (ref 4.0–10.5)
nRBC: 0 % (ref 0.0–0.2)

## 2020-07-29 LAB — GLUCOSE, CAPILLARY: Glucose-Capillary: 89 mg/dL (ref 70–99)

## 2020-07-29 NOTE — Progress Notes (Addendum)
Brandon Antigua, MD [address] [place], [place], [place], [address] [address], [place], [place], [place], [address] Phone: [phone]  Fax: [phone]   Subjective: Patient reports that he does not feel good this morning.  Reports having 3-4 over 10 abdominal pain that started after drinking Ensure.  Prior to this he states abdominal pain was mild, about 2/10.  No nausea or vomiting.  Has had a bowel movement today that he states is formed.  Had 2-3 bowel movements yesterday that he states were soft.  Denies blood in stool.  No nausea or vomiting.  No fever or chills.   Objective: Exam: Vital signs in last 24 hours: Vitals:   07/28/20 1517 07/28/20 2017 07/29/20 0518 07/29/20 0808  BP: 117/67 128/75 119/72 131/76  Pulse: 66 63 61 (!) 59  Resp: 16 18 16 20   Temp: 98.1 F (36.7 C) 98.3 F (36.8 C) 98.6 F (37 C) 97.8 F (36.6 C)  TempSrc:  Oral Oral   SpO2: 100% 98% 96% 100%  Weight:      Height:       Weight change:   Intake/Output Summary (Last 24 hours) at 07/29/2020 1017 Last data filed at 07/29/2020 0726 Gross per 24 hour  Intake 1882.5 ml  Output 3750 ml  Net -1867.5 ml    Constitutional: General:   Alert,  Well-developed, well-nourished, pleasant and cooperative in NAD BP 131/76   Pulse (!) 59   Temp 97.8 F (36.6 C)   Resp 20   Ht 6' (1.829 m)   Wt 98.9 kg   SpO2 100%   BMI 29.58 kg/m   Eyes:  Sclera clear, no icterus.   Conjunctiva pink.   Ears:  No scars, lesions or masses, Normal auditory acuity. Nose:  No deformity, discharge, or lesions. Mouth:  No deformity or lesions, oropharynx pink & moist.  Neck:  Supple; no masses, trachea midline  Respiratory: Normal respiratory effort  Gastrointestinal:  Soft, non-tender and non-distended without masses, hepatosplenomegaly or hernias noted.  No guarding or rebound tenderness.     Cardiac: No clubbing or edema.  No cyanosis. Normal posterior tibial pedal pulses noted.  Lymphatic:  No  significant cervical adenopathy.  Psych:  Alert and cooperative. Normal mood and affect.  Musculoskeletal:  Head normocephalic, atraumatic. 5/5 Lower extremity strength bilaterally.  Skin: Warm. Intact without significant lesions or rashes. No jaundice.  Neurologic:  Face symmetrical, tongue midline, Normal sensation to touch  Psych:  Alert and oriented x3, Alert and cooperative. Normal mood and affect.   Lab Results: Lab Results  Component Value Date   WBC 6.7 07/29/2020   HGB 13.4 07/29/2020   HCT 42.0 07/29/2020   MCV 89.0 07/29/2020   PLT 160 07/29/2020   Micro Results: Recent Results (from the past 240 hour(s))  SARS CORONAVIRUS 2 (TAT 6-24 HRS) Nasopharyngeal Nasopharyngeal Swab     Status: None   Collection Time: 07/27/20  9:37 AM   Specimen: Nasopharyngeal Swab  Result Value Ref Range Status   SARS Coronavirus 2 NEGATIVE NEGATIVE Final    Comment: (NOTE) SARS-CoV-2 target nucleic acids are NOT DETECTED.  The SARS-CoV-2 RNA is generally detectable in upper and lower respiratory specimens during the acute phase of infection. Negative results do not preclude SARS-CoV-2 infection, do not rule out co-infections with other pathogens, and should not be used as the sole basis for treatment or other patient management decisions. Negative results must be combined with clinical observations, patient history, and epidemiological information. The  expected result is Negative.  Fact Sheet for Patients: SugarRoll.be  Fact Sheet for Healthcare Providers: https://www.woods-mathews.com/  This test is not yet approved or cleared by the [place] FDA and  has been authorized for detection and/or diagnosis of SARS-CoV-2 by FDA under an Emergency Use Authorization (EUA). This EUA will remain  in effect (meaning this test can be used) for the duration of the COVID-19 declaration under Se ction 564(b)(1) of the Act, 21 U.S.C. section  360bbb-3(b)(1), unless the authorization is terminated or revoked sooner.  Performed at [facility], [place] [address], [place], [place] [address]    Studies/Results: CT ABDOMEN PELVIS W CONTRAST  Result Date: 07/27/2020 CLINICAL DATA:  Paraumbilical abdominal pain and bloating beginning this morning. Previous history of small bowel obstruction. EXAM: CT ABDOMEN AND PELVIS WITH CONTRAST TECHNIQUE: Multidetector CT imaging of the abdomen and pelvis was performed using the standard protocol following bolus administration of intravenous contrast. CONTRAST:  127mL OMNIPAQUE IOHEXOL 300 MG/ML  SOLN COMPARISON:  04/14/2015 FINDINGS: Lower Chest: No acute findings. Hepatobiliary: No hepatic masses identified. A tiny calcified gallstone is noted, however there is no evidence of cholecystitis or biliary ductal dilatation. Pancreas:  No mass or inflammatory changes. Spleen: Within normal limits in size and appearance. Adrenals/Urinary Tract: No masses identified. A few tiny right renal cysts are again noted. No evidence of ureteral calculi or hydronephrosis. Stomach/Bowel: Moderate dilatation of small bowel loops is seen with fecal matter seen in distal small bowel in the right abdomen. A transition point is seen in the lateral right abdomen in the mid to distal ileum, similar to prior study in 2017, likely due to adhesion. Nondilated loops of ileum distal of this point show mild wall thickening and mucosal enhancement, also similar to prior study in 2017, and suspicious for mild/chronic enteritis. Small amount of free fluid noted in the pelvis. No evidence of abscess. Normal appendix visualized. Vascular/Lymphatic: No pathologically enlarged lymph nodes. No acute vascular findings. Reproductive:  No mass or other significant abnormality. Other:  None. Musculoskeletal:  No suspicious bone lesions identified. IMPRESSION: Distal small bowel obstruction, with transition point in lateral right abdomen suspicious for  an adhesion. Mild wall thickening and mucosal enhancement of nondilated ileum distal to the transition point, suspicious for mild/chronic enteritis such as Crohn disease. Small amount of free fluid. No evidence of abscess. Cholelithiasis. No radiographic evidence of cholecystitis. Electronically Signed   By: Marlaine Hind M.D.   On: 07/27/2020 11:25   DG ABD ACUTE 2+V W 1V CHEST  Result Date: 07/29/2020 CLINICAL DATA:  65 year old male with history of small-bowel obstruction. EXAM: DG ABDOMEN ACUTE WITH 1 VIEW CHEST COMPARISON:  07/28/2020, 07/27/2020 FINDINGS: Similar appearing mild gaseous distension of loops of small bowel in the left upper quadrant. No abnormal air-fluid levels. There is interval propagation of contrast material through the colon which contains gas. No pneumoperitoneum. Punctate calcification in the right upper quadrant compatible with calcified gallstone on recent comparison CT. No additional abnormal radiographic calcifications. No acute osseous abnormality. IMPRESSION: 1. Resolving small bowel obstruction. 2. Cholelithiasis. Electronically Signed   By: Ruthann Cancer MD   On: 07/29/2020 08:54   DG ABD ACUTE 2+V W 1V CHEST  Result Date: 07/28/2020 CLINICAL DATA:  Pain.  Vomiting. EXAM: DG ABDOMEN ACUTE WITH 1 VIEW CHEST COMPARISON:  CT 07/27/2020. FINDINGS: Soft tissue structures are unremarkable. Several loops of dilated bowel, most likely small bowel again noted. Bowel wall thickening is also again noted. These findings suggest the possibility  of an active bowel process such as enteritis. Associated small bowel obstruction/partial obstruction cannot be excluded. Oral contrast from prior CT noted in the colon. No free air. Left base subsegmental atelectasis. Degenerative change lumbar spine. IMPRESSION: 1. Several loops of dilated bowel, most likely small bowel again noted. Bowel wall thickening is also again noted. These findings suggest possibly of an active bowel process such as  enteritis. Associated small bowel obstruction/partial small bowel obstruction cannot be excluded. Oral contrast from prior CT is noted in the colon. 2.  Mild left base atelectasis. Electronically Signed   By: Marcello Moores  Register   On: 07/28/2020 07:42   US Abdomen Limited RUQ (LIVER/GB)  Result Date: 07/27/2020 CLINICAL DATA:  Cholelithiasis.  Upper abdominal pain. EXAM: ULTRASOUND ABDOMEN LIMITED RIGHT UPPER QUADRANT COMPARISON:  CT scan earlier same day FINDINGS: Gallbladder: Shadowing gallstone measures 10 mm. Tiny cholesterol polyp identified in the gallbladder. No gallbladder wall thickening or pericholecystic fluid. The sonographer reports no sonographic Murphy sign. Common bile duct: Diameter: 6 mm Liver: Mild coarsening of hepatic echotexture suggests a component of underlying fatty deposition. Portal vein is patent on color Doppler imaging with normal direction of blood flow towards the liver. Other: None. IMPRESSION: 1. Cholelithiasis. No sonographic findings to suggest acute cholecystitis. 2. Question mild hepatic steatosis. Electronically Signed   By: Misty Stanley M.D.   On: 07/27/2020 15:19   Medications:  Scheduled Meds:  feeding supplement  1 Container Oral TID BM   multivitamin with minerals  1 tablet Oral Daily   Continuous Infusions:  lactated ringers Stopped (07/28/20 1315)   piperacillin-tazobactam (ZOSYN)  IV 3.375 g (07/29/20 0626)   PRN Meds:.acetaminophen, morphine injection, ondansetron (ZOFRAN) IV   Assessment: Principal Problem:   SBO (small bowel obstruction) (HCC) Active Problems:   Leukocytosis Abdominal pain   Plan: Patient reports having abdominal pain after drinking Ensure today.  The abdominal pain was not present yesterday and patient and family at bedside stated seems to have started after the Ensure.  Abdominal x-ray was done today, report reviewed and shows resolving small bowel obstruction.  I personally reviewed the images and bowel dilation compared  to yesterday has improved.  Overall, clinically patient has shown improvement and perhaps abdominal pain that occurred today can be attributed to the Ensure itself.  He is not clinically obstructed and that he is not having any nausea or vomiting, is having flatus, and bowel movements which is all very reassuring.  I discussed the above with primary team, Dr. Manuella Ghazi and he states surgery is already recommended advancing to soft diet.  Continue advancing to soft diet gradually and monitor symptoms carefully and if patient continues to improve as expected, okay to advance diet, otherwise may need to de-escalate diet for short period of time.  Given that imaging and overall clinically patient has improved since admission with antibiotics, continue current management  Pain control and IV fluids as needed  However, if patient's abdominal pain does not improve, or if patient clinically worsens, while diet is being advanced, may need to consider colonoscopy and or MRE/CTE as an inpatient  The importance of avoiding NSAIDs given that he was taking meloxicam daily for 2 months, were discussed with the patient and his family today   LOS: 2 days   Brandon Antigua, MD 07/29/2020, 10:17 AM

## 2020-07-29 NOTE — Progress Notes (Signed)
[facility] SURGICAL PROGRESS NOTE (cpt [phone])  Hospital Day(s): 2.    Interval History: Patient seen and examined, no acute events or new complaints overnight. Patient reports he continues to feel better. No fever, chills, nausea, emesis, abdominal pain. He is passing flatus and having BMs. He remains without leukocytosis; WBC 6.7K. BMP is reassuring. He has tolerated advancement of diet to full liquids, and he is having a regular breakfast. No other complaints.   Review of Systems:  Constitutional: denies fever, chills  HEENT: denies cough or congestion  Respiratory: denies any shortness of breath  Cardiovascular: denies chest pain or palpitations  Gastrointestinal: denies abdominal pain, N/V, or diarrhea/and bowel function as per interval history Genitourinary: denies burning with urination or urinary frequency  Vital signs in last 24 hours: [min-max] current  Temp:  [97.8 F (36.6 C)-98.6 F (37 C)] 97.8 F (36.6 C) (07/06 0808) Pulse Rate:  [59-66] 59 (07/06 0808) Resp:  [16-20] 20 (07/06 0808) BP: (117-131)/(67-76) 131/76 (07/06 0808) SpO2:  [96 %-100 %] 100 % (07/06 0808)     Height: 6' (182.9 cm) Weight: 98.9 kg BMI (Calculated): 29.57   Intake/Output last 2 shifts:  07/05 0701 - 07/06 0700 In: 1882.5 [P.O.:1270; I.V.:600; IV Piggyback:12.5] Out: 3250 [Urine:3250]   Physical Exam:  Constitutional: alert, cooperative and no distress  HENT: normocephalic without obvious abnormality  Eyes: PERRL, EOM's grossly intact and symmetric  Respiratory: breathing non-labored at rest  Cardiovascular: regular rate and sinus rhythm  Gastrointestinal: soft, non-tender, and non-distended, no rebound/guarding Musculoskeletal: no edema or wounds, motor and sensation grossly intact, NT    Labs:  CBC Latest Ref Rng & Units 07/29/2020 07/28/2020 07/27/2020  WBC 4.0 - 10.5 K/uL 6.7 9.3 12.8(H)  Hemoglobin 13.0 - 17.0 g/dL 13.4 14.2 16.4  Hematocrit 39.0 - 52.0 % 42.0 44.5 50.2   Platelets 150 - 400 K/uL 160 210 202   CMP Latest Ref Rng & Units 07/29/2020 07/28/2020 07/27/2020  Glucose 70 - 99 mg/dL 99 103(H) 139(H)  BUN 8 - 23 mg/dL 15 25(H) 25(H)  Creatinine 0.61 - 1.24 mg/dL 0.95 1.02 1.04  Sodium 135 - 145 mmol/L 139 138 137  Potassium 3.5 - 5.1 mmol/L 4.3 4.2 4.6  Chloride 98 - 111 mmol/L 107 102 102  CO2 22 - 32 mmol/L 30 28 24   Calcium 8.9 - 10.3 mg/dL 8.4(L) 8.4(L) 9.7  Total Protein 6.5 - 8.1 g/dL - 6.5 8.4(H)  Total Bilirubin 0.3 - 1.2 mg/dL - 1.2 0.9  Alkaline Phos 38 - 126 U/L - 56 75  AST 15 - 41 U/L - 18 24  ALT 0 - 44 U/L - 14 21     Imaging studies:  No new pertinent imaging studies    Assessment/Plan: (ICD-10's: K62.609) 65 y.o. male with possible small bowel obstruction without any surgical history secondary to inflammatory changes at the level of the ileum concerning for possible Crohn's disease vs enteritis    - Advance to soft diet  - Discontinue IVF  - Appreciate GI input and assistance   - Monitor abdominal examination; on-going bowel function - Pain control prn (minimize narcotics); antiemetics prn - No surgical intervention warranted currently   - Mobilization as tolerated - Further management per primary service    - Discharge Planning; If tolerates advancement of diet this morning, he should be okay for discharge this afternoon. Follow up with GI per their recommendation. He can be seen as outpatient in 3-4 weeks in surgery clinic.   All of  the above findings and recommendations were discussed with the patient, and the medical team, and all of patient's questions were answered to his expressed satisfaction.  -- Edison Simon, PA-C [facility] 07/29/2020, 8:35 AM [phone] M-F: 7am - 4pm

## 2020-07-29 NOTE — Plan of Care (Signed)

## 2020-07-29 NOTE — Telephone Encounter (Signed)
A voicemail was received from [facility] that patient needs a 2 week follow up appt with Dr. Bonna Gains. Please call patient at [phone].

## 2020-07-30 ENCOUNTER — Telehealth: Payer: Self-pay

## 2020-07-30 NOTE — Telephone Encounter (Signed)
error 

## 2020-07-30 NOTE — Discharge Summary (Signed)
[place] [facility] [place] NAME: Brandon Montes    MR#:  [id number]  DATE OF BIRTH:  Oct 10, 1955  DATE OF ADMISSION:  07/27/2020   ADMITTING PHYSICIAN: Ivor Costa, MD  DATE OF DISCHARGE: 07/29/2020  3:56 PM  PRIMARY CARE PHYSICIAN: Pcp, No   ADMISSION DIAGNOSIS:  SBO (small bowel obstruction) ([place]) [K56.609] Cholelithiasis [K80.20] DISCHARGE DIAGNOSIS:  Principal Problem:   SBO (small bowel obstruction) (HCC) Active Problems:   Leukocytosis  SECONDARY DIAGNOSIS:   Past Medical History:  Diagnosis Date   Fatty liver    Gall stones    HOSPITAL COURSE:  65 y.o. 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 patient was seen by GI & surgery while in the hospital. Patient's symptoms had resolved and he tolerated diet. GI & surgery recommends outpt f/up for possible MRI, colonoscopy, capsule study &/surgery.  DISCHARGE CONDITIONS:  stable CONSULTS OBTAINED:  Treatment Team:  Jules Husbands, MD Jonathon Bellows, MD DRUG ALLERGIES:   Allergies  Allergen Reactions   Shellfish-Derived Products Nausea And Vomiting   DISCHARGE MEDICATIONS:   Allergies as of 07/29/2020       Reactions   Shellfish-derived Products Nausea And Vomiting        Medication List     STOP taking these medications    meloxicam 15 MG tablet Commonly known as: Mobic       DISCHARGE INSTRUCTIONS:   DIET:  Regular diet DISCHARGE CONDITION:  Stable ACTIVITY:  Activity as tolerated OXYGEN:  Home Oxygen: No.  Oxygen Delivery: room air DISCHARGE LOCATION:  home   If you experience worsening of your admission symptoms, develop shortness of breath, life threatening emergency, suicidal or homicidal thoughts you must seek medical attention immediately by  calling 911 or calling your MD immediately  if symptoms less severe.  You Must read complete instructions/literature along with all the possible adverse reactions/side effects for all the Medicines you take and that have been prescribed to you. Take any new Medicines after you have completely understood and accpet all the possible adverse reactions/side effects.   Please note  You were cared for by a hospitalist during your hospital stay. If you have any questions about your discharge medications or the care you received while you were in the hospital after you are discharged, you can call the unit and asked to speak with the hospitalist on call if the hospitalist that took care of you is not available. Once you are discharged, your primary care physician will handle any further medical issues. Please note that NO REFILLS for any discharge medications will be authorized once you are discharged, as it is imperative that you return to your primary care physician (or establish a relationship with a primary care physician if you do not have one) for your aftercare needs so that they can reassess your need for medications and monitor your lab values.    On the day of Discharge:  VITAL SIGNS:  Blood pressure 131/76, pulse (!) 59, temperature 97.8 F (36.6 C), resp. rate 20, height 6' (1.829 m), weight 98.9 kg, SpO2 100 %. PHYSICAL EXAMINATION:  GENERAL:  65 y.o.-year-old patient lying in the bed with no acute distress.  EYES: Pupils equal, round, reactive to light and accommodation. No scleral icterus. Extraocular muscles intact.  HEENT: Head  atraumatic, normocephalic. Oropharynx and nasopharynx clear.  NECK:  Supple, no jugular venous distention. No thyroid enlargement, no tenderness.  LUNGS: Normal breath sounds bilaterally, no wheezing, rales,rhonchi or crepitation. No use of accessory muscles of respiration.  CARDIOVASCULAR: S1, S2 normal. No murmurs, rubs, or gallops.  ABDOMEN: Soft, non-tender,  non-distended. Bowel sounds present. No organomegaly or mass.  EXTREMITIES: No pedal edema, cyanosis, or clubbing.  NEUROLOGIC: Cranial nerves II through XII are intact. Muscle strength 5/5 in all extremities. Sensation intact. Gait not checked.  PSYCHIATRIC: The patient is alert and oriented x 3.  SKIN: No obvious rash, lesion, or ulcer.  DATA REVIEW:   CBC Recent Labs  Lab 07/29/20 0554  WBC 6.7  HGB 13.4  HCT 42.0  PLT 160    Chemistries  Recent Labs  Lab 07/28/20 0517 07/29/20 0554  NA 138 139  K 4.2 4.3  CL 102 107  CO2 28 30  GLUCOSE 103* 99  BUN 25* 15  CREATININE 1.02 0.95  CALCIUM 8.4* 8.4*  MG 2.0  --   AST 18  --   ALT 14  --   ALKPHOS 56  --   BILITOT 1.2  --      Outpatient follow-up  Follow-up Information     [place], [place] F, MD. Schedule an appointment as soon as possible for a visit on 08/19/2020.   Specialty: General Surgery Why: [facility] Discharge F/UP 2:30pm appointment Contact information: [address] [place] [address] [phone]         Jonathon Bellows, MD. Schedule an appointment as soon as possible for a visit in 2 week(s).   Specialty: Gastroenterology Why: [facility] Discharge F/UP( left a message for them to call patient back) Contact information: [place] [phone]                 30 Day Unplanned Readmission Risk Score    Flowsheet Row ED to Hosp-Admission (Discharged) from 07/27/2020 in [facility]  30 Day Unplanned Readmission Risk Score (%) 6.07 Filed at 07/29/2020 1200       This score is the patient's risk of an unplanned readmission within 30 days of being discharged (0 -100%). The score is based on dignosis, age, lab data, medications, orders, and past utilization.   Low:  0-14.9   Medium: 15-21.9   High: 22-29.9   Extreme: 30 and above           Management plans discussed with the patient,  family and they are in agreement.  CODE STATUS: Prior   TOTAL TIME TAKING CARE OF THIS PATIENT: 45 minutes.    Max Sane M.D on 07/30/2020 at 7:42 PM  [facility]   CC: Primary care physician; Pcp, No   Note: This dictation was prepared with Dragon dictation along with smaller phrase technology. Any transcriptional errors that result from this process are unintentional.

## 2020-07-30 NOTE — Telephone Encounter (Signed)
Brandon Manifold, MD  Kaileah Shevchenko, Tera Partridge, CMA; Alverda Skeans, RN; Ezzard Standing, PA-C  As per Dr. Georgeann Oppenheim initial consult note this patient's primary GI is [facility] clinic GI. I do see notes in care everywhere by them as well. I am ccing the provider that saw him before at [facility] clinic GI to see if they can helpfacilitate his appointment with them.

## 2020-08-03 ENCOUNTER — Ambulatory Visit (INDEPENDENT_AMBULATORY_CARE_PROVIDER_SITE_OTHER): Payer: BC Managed Care – PPO | Admitting: Podiatry

## 2020-08-03 ENCOUNTER — Other Ambulatory Visit: Payer: Self-pay

## 2020-08-03 ENCOUNTER — Encounter: Payer: Self-pay | Admitting: Podiatry

## 2020-08-03 DIAGNOSIS — M7662 Achilles tendinitis, left leg: Secondary | ICD-10-CM | POA: Diagnosis not present

## 2020-08-03 DIAGNOSIS — M216X2 Other acquired deformities of left foot: Secondary | ICD-10-CM

## 2020-08-03 DIAGNOSIS — M722 Plantar fascial fibromatosis: Secondary | ICD-10-CM | POA: Diagnosis not present

## 2020-08-03 DIAGNOSIS — M21862 Other specified acquired deformities of left lower leg: Secondary | ICD-10-CM

## 2020-08-03 DIAGNOSIS — M9262 Juvenile osteochondrosis of tarsus, left ankle: Secondary | ICD-10-CM

## 2020-08-03 NOTE — Progress Notes (Addendum)
  Subjective:  Patient ID: Brandon Montes, male    DOB: 1955/03/20,  MRN: [id number]  No chief complaint on file.    65 y.o. male returns with the above complaint. History confirmed with patient.  Pain started to return and regressed.  Objective:  Physical Exam: warm, good capillary refill, no trophic changes or ulcerative lesions, normal DP and PT pulses and normal sensory exam.  Bilateral plantar fascial fibromas Left Foot: Significant pain on palpation of the posterior heel at the Achilles insertion he has gastrocnemius equinus     Assessment:   1. Achilles tendinitis of left lower extremity   2. Gastrocnemius equinus of left lower extremity   3. Plantar fascial fibromatosis of left foot   4. Plantar fascial fibromatosis of right foot   5. Haglund's deformity of left heel       Plan:  Patient was evaluated and treated and all questions answered.  Discussed the etiology and treatment options for achilles tendinitis including stretching, formal physical therapy, supportive shoegears such as a running shoe or sneaker,and oral medications. We also discussed the role of surgical treatment of this for patients who do not improve after exhausting non-surgical treatment options.   Achilles Tendonitis -Resume home therapy exercises -Resume meloxicam as needed. -Discussed further treatment with EPAT or platelet rich plasma injection.  That he be best we order an MRI to see if this is something that would benefit from this or if we start considering open surgery.  He will return to see me in August.  After sterile prep with alcohol I injected the plantar fibroma on each side with 2 mg of dexamethasone and 5 mg of Kenalog he tolerated this well   Return in about 4 weeks (around 08/31/2020) for after MRI to review.

## 2020-08-18 ENCOUNTER — Other Ambulatory Visit: Payer: BC Managed Care – PPO

## 2020-08-19 ENCOUNTER — Encounter: Payer: BC Managed Care – PPO | Admitting: Surgery

## 2020-08-23 DIAGNOSIS — H6123 Impacted cerumen, bilateral: Secondary | ICD-10-CM | POA: Diagnosis not present

## 2020-08-30 ENCOUNTER — Other Ambulatory Visit: Payer: Self-pay | Admitting: Podiatry

## 2020-08-31 ENCOUNTER — Ambulatory Visit: Payer: BC Managed Care – PPO | Admitting: Podiatry

## 2020-09-01 ENCOUNTER — Ambulatory Visit
Admission: RE | Admit: 2020-09-01 | Discharge: 2020-09-01 | Disposition: A | Discharge status: Home / Self Care | Payer: BC Managed Care – PPO | Source: Ambulatory Visit | Attending: Podiatry | Admitting: Podiatry

## 2020-09-01 ENCOUNTER — Other Ambulatory Visit: Payer: Self-pay

## 2020-09-01 DIAGNOSIS — M7662 Achilles tendinitis, left leg: Secondary | ICD-10-CM

## 2020-09-01 DIAGNOSIS — G8929 Other chronic pain: Secondary | ICD-10-CM | POA: Diagnosis not present

## 2020-09-01 DIAGNOSIS — M9262 Juvenile osteochondrosis of tarsus, left ankle: Secondary | ICD-10-CM

## 2020-09-01 DIAGNOSIS — S93412A Sprain of calcaneofibular ligament of left ankle, initial encounter: Secondary | ICD-10-CM | POA: Diagnosis not present

## 2020-09-01 DIAGNOSIS — M722 Plantar fascial fibromatosis: Secondary | ICD-10-CM

## 2020-09-14 ENCOUNTER — Other Ambulatory Visit: Payer: Self-pay

## 2020-09-14 ENCOUNTER — Ambulatory Visit (INDEPENDENT_AMBULATORY_CARE_PROVIDER_SITE_OTHER): Payer: BC Managed Care – PPO | Admitting: Podiatry

## 2020-09-14 DIAGNOSIS — M722 Plantar fascial fibromatosis: Secondary | ICD-10-CM | POA: Diagnosis not present

## 2020-09-14 DIAGNOSIS — M7662 Achilles tendinitis, left leg: Secondary | ICD-10-CM

## 2020-09-14 DIAGNOSIS — M216X2 Other acquired deformities of left foot: Secondary | ICD-10-CM

## 2020-09-14 DIAGNOSIS — M9262 Juvenile osteochondrosis of tarsus, left ankle: Secondary | ICD-10-CM | POA: Diagnosis not present

## 2020-09-14 DIAGNOSIS — M21862 Other specified acquired deformities of left lower leg: Secondary | ICD-10-CM

## 2020-09-14 NOTE — Progress Notes (Signed)
  Subjective:  Patient ID: Brandon Montes, male    DOB: 04-26-1955,  MRN: [id number]  Chief Complaint  Patient presents with   Tendonitis    4 week follow up achilles tendinitis left      65 y.o. male returns with the above complaint. History confirmed with patient.  Has had some improvement after he completed the MRI  Objective:  Physical Exam: warm, good capillary refill, no trophic changes or ulcerative lesions, normal DP and PT pulses and normal sensory exam.  Bilateral plantar fascial fibromas, neither are painful and reduced in size Left Foot: Mild pain on palpation to the posterior heel at the Achilles insertion  Study Result  Narrative & Impression  CLINICAL DATA:  Chronic posterior heel pain.  No prior surgery.   EXAM: MR OF THE LEFT HEEL WITHOUT CONTRAST   TECHNIQUE: Multiplanar, multisequence MR imaging of the left ankle was performed. No intravenous contrast was administered.   COMPARISON:  Left foot x-rays dated December 12, 2019.   FINDINGS: TENDONS   Peroneal: Peroneal longus tendon intact. Peroneal brevis intact.   Posteromedial: Distal posterior tibial tendinosis with small partial tear. Flexor digitorum longus tendon intact. Flexor hallucis longus tendon intact.   Anterior: Tibialis anterior tendon intact. Extensor hallucis longus tendon intact Extensor digitorum longus tendon intact.   Achilles: Intact. Moderate thickening of the mid to distal Achilles tendon.   Plantar Fascia: Intact.   LIGAMENTS   Lateral: Thinning of the anterior talofibular and calcaneofibular ligaments, consistent with chronic partial tears. Posterior talofibular ligament intact. Anterior and posterior tibiofibular ligaments intact.   Medial: Deltoid ligament intact. Spring ligament intact.   CARTILAGE   Ankle Joint: No joint effusion. Normal ankle mortise. Minimal degenerative changes.   Subtalar Joints/Sinus Tarsi: Normal subtalar joints. No subtalar joint  effusion. Normal sinus tarsi.   Bones: No marrow signal abnormality.  No fracture or dislocation.   Soft Tissue: No soft tissue mass or fluid collection.   IMPRESSION: 1. Moderate Achilles tendinosis. No tear. 2. Distal posterior tibial tendinosis with small partial tear. 3. 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     Electronically Signed   By: Titus Dubin M.D.   On: 09/01/2020 13:56    Assessment:   1. Achilles tendinitis of left lower extremity   2. Gastrocnemius equinus of left lower extremity   3. Plantar fascial fibromatosis of left foot   4. Plantar fascial fibromatosis of right foot   5. Haglund's deformity of left heel        Plan:  Patient was evaluated and treated and all questions answered.  Discussed the etiology and treatment options for achilles tendinitis including stretching, formal physical therapy, supportive shoegears such as a running shoe or sneaker,and oral medications. We also discussed the role of surgical treatment of this for patients who do not improve after exhausting non-surgical treatment options.   Achilles Tendonitis -Continue home therapy exercises -Tylenol for pain control he was advised not to take too much of this or any meloxicam -We discussed treatment with EPAT or PRP injection prior to any sort of open surgery and he will consider this for later this fall after his softball season is done and he is able to rest..  Fibromas are stable and we will reinject as needed   Return in about 11 weeks (around 11/30/2020).

## 2020-09-29 ENCOUNTER — Ambulatory Visit: Payer: BC Managed Care – PPO | Admitting: Gastroenterology

## 2020-11-04 ENCOUNTER — Ambulatory Visit (INDEPENDENT_AMBULATORY_CARE_PROVIDER_SITE_OTHER): Payer: BC Managed Care – PPO | Admitting: Podiatry

## 2020-11-04 ENCOUNTER — Other Ambulatory Visit: Payer: Self-pay

## 2020-11-04 DIAGNOSIS — M722 Plantar fascial fibromatosis: Secondary | ICD-10-CM | POA: Diagnosis not present

## 2020-11-04 DIAGNOSIS — M7662 Achilles tendinitis, left leg: Secondary | ICD-10-CM

## 2020-11-04 DIAGNOSIS — M62462 Contracture of muscle, left lower leg: Secondary | ICD-10-CM

## 2020-11-04 DIAGNOSIS — M21862 Other specified acquired deformities of left lower leg: Secondary | ICD-10-CM

## 2020-11-05 NOTE — Progress Notes (Signed)
Subjective:  Patient ID: Brandon Montes, male    DOB: Nov 16, 1955,  MRN: [id number]  Chief Complaint  Patient presents with   Tendonitis    Bilateral, requesting injections      65 y.o. male returns with the above complaint. History confirmed with patient.  Achilles continues to be painful and the fibromas are painful as well  Objective:  Physical Exam: warm, good capillary refill, no trophic changes or ulcerative lesions, normal DP and PT pulses and normal sensory exam.  Bilateral plantar fascial fibromas, neither are painful and reduced in size Left Foot: Mild pain on palpation to the posterior heel at the Achilles insertion  Study Result  Narrative & Impression  CLINICAL DATA:  Chronic posterior heel pain.  No prior surgery.   EXAM: MR OF THE LEFT HEEL WITHOUT CONTRAST   TECHNIQUE: Multiplanar, multisequence MR imaging of the left ankle was performed. No intravenous contrast was administered.   COMPARISON:  Left foot x-rays dated December 12, 2019.   FINDINGS: TENDONS   Peroneal: Peroneal longus tendon intact. Peroneal brevis intact.   Posteromedial: Distal posterior tibial tendinosis with small partial tear. Flexor digitorum longus tendon intact. Flexor hallucis longus tendon intact.   Anterior: Tibialis anterior tendon intact. Extensor hallucis longus tendon intact Extensor digitorum longus tendon intact.   Achilles: Intact. Moderate thickening of the mid to distal Achilles tendon.   Plantar Fascia: Intact.   LIGAMENTS   Lateral: Thinning of the anterior talofibular and calcaneofibular ligaments, consistent with chronic partial tears. Posterior talofibular ligament intact. Anterior and posterior tibiofibular ligaments intact.   Medial: Deltoid ligament intact. Spring ligament intact.   CARTILAGE   Ankle Joint: No joint effusion. Normal ankle mortise. Minimal degenerative changes.   Subtalar Joints/Sinus Tarsi: Normal subtalar joints. No  subtalar joint effusion. Normal sinus tarsi.   Bones: No marrow signal abnormality.  No fracture or dislocation.   Soft Tissue: No soft tissue mass or fluid collection.   IMPRESSION: 1. Moderate Achilles tendinosis. No tear. 2. Distal posterior tibial tendinosis with small partial tear. 3. 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     Electronically Signed   By: Titus Dubin M.D.   On: 09/01/2020 13:56    Assessment:   1. Plantar fascial fibromatosis of left foot   2. Achilles tendinitis of left lower extremity   3. Gastrocnemius equinus of left lower extremity        Plan:  Patient was evaluated and treated and all questions answered.  Continues to have pain both at the Achilles and the fibromas.  He would like to not continue with injections of the fibroma long-term.  He is interested in surgical correction of this as well as the Achilles.  Previously we have discussed Topaz and platelet rich plasma injection.  We discussed the risk benefits and potential complications of this as well as plantar fibroma excision including but not limited to pain, swelling, infection, scar, numbness which may be temporary or permanent, chronic pain, stiffness, nerve pain or damage, wound healing problems.  He understands and wishes to proceed.  He also understands that he will be nonweightbearing in a CAM boot after to allow the fibroma incision to heal appropriately.  Informed consent was signed and reviewed.  Following sterile prep with alcohol each fibroma was injected with 10 mg of Kenalog and 4 mg of dexamethasone as well for temporary relief     Surgical plan:  Procedure: -Achilles secondary repair with Topaz and plate rich plasma  injection, plantar fibroma excision left foot  Location: -[facility]  Anesthesia plan: -IV sedation with regional block  Postoperative pain plan: - Tylenol 1000 mg every 6 hours, ibuprofen 600 mg every 6 hours,  gabapentin 300 mg every 8 hours x5 days, oxycodone 5 mg 1-2 tabs every 6 hours only as needed  DVT prophylaxis: -None required  WB Restrictions / DME needs: -NWB in CAM boot this was dispensed today    No follow-ups on file.

## 2020-11-18 DIAGNOSIS — R935 Abnormal findings on diagnostic imaging of other abdominal regions, including retroperitoneum: Secondary | ICD-10-CM | POA: Diagnosis not present

## 2020-11-18 DIAGNOSIS — Z8719 Personal history of other diseases of the digestive system: Secondary | ICD-10-CM | POA: Diagnosis not present

## 2020-12-02 ENCOUNTER — Ambulatory Visit: Payer: BC Managed Care – PPO | Admitting: Podiatry

## 2020-12-10 ENCOUNTER — Telehealth: Payer: Self-pay | Admitting: Urology

## 2020-12-10 NOTE — Telephone Encounter (Signed)
DOS - 01/01/21  PLANTAR FIBROMA LEFT --- 91505 REPAIR ACHILLES LEFT --- [id number]  [employer] EFFECTIVE DATE - 06/24/20  PLAN DEDUCTIBLE - $5,000.00 W/ $0.00 REMAINING OUT OF POCKET -  $7,000.00 W/ $1,752.[place] -  20% COPAY - $0.00   NO PRIOR AUTH REQUIRED

## 2020-12-28 DIAGNOSIS — H43811 Vitreous degeneration, right eye: Secondary | ICD-10-CM | POA: Diagnosis not present

## 2020-12-28 DIAGNOSIS — H2513 Age-related nuclear cataract, bilateral: Secondary | ICD-10-CM | POA: Diagnosis not present

## 2021-01-01 ENCOUNTER — Other Ambulatory Visit: Payer: Self-pay | Admitting: Podiatry

## 2021-01-01 DIAGNOSIS — D492 Neoplasm of unspecified behavior of bone, soft tissue, and skin: Secondary | ICD-10-CM | POA: Diagnosis not present

## 2021-01-01 DIAGNOSIS — M7662 Achilles tendinitis, left leg: Secondary | ICD-10-CM | POA: Diagnosis not present

## 2021-01-01 DIAGNOSIS — M722 Plantar fascial fibromatosis: Secondary | ICD-10-CM | POA: Diagnosis not present

## 2021-01-01 DIAGNOSIS — D2122 Benign neoplasm of connective and other soft tissue of left lower limb, including hip: Secondary | ICD-10-CM | POA: Diagnosis not present

## 2021-01-01 MED ORDER — OXYCODONE HCL 5 MG PO TABS: 5.0000 mg | ORAL_TABLET | ORAL | 0 refills | Status: AC | PRN

## 2021-01-01 MED ORDER — GABAPENTIN 300 MG PO CAPS: 300.0000 mg | ORAL_CAPSULE | Freq: Three times a day (TID) | ORAL | 0 refills | Status: DC

## 2021-01-01 MED ORDER — ACETAMINOPHEN 500 MG PO TABS: 1000.0000 mg | ORAL_TABLET | Freq: Four times a day (QID) | ORAL | 0 refills | Status: AC | PRN

## 2021-01-01 NOTE — Progress Notes (Unsigned)
12/9 Topaz and PRP Achilles, plantar fibroma excision left

## 2021-01-06 ENCOUNTER — Ambulatory Visit (INDEPENDENT_AMBULATORY_CARE_PROVIDER_SITE_OTHER): Payer: BC Managed Care – PPO | Admitting: Podiatry

## 2021-01-06 ENCOUNTER — Other Ambulatory Visit: Payer: Self-pay

## 2021-01-06 DIAGNOSIS — M7662 Achilles tendinitis, left leg: Secondary | ICD-10-CM

## 2021-01-06 DIAGNOSIS — M79676 Pain in unspecified toe(s): Secondary | ICD-10-CM

## 2021-01-06 DIAGNOSIS — Z9889 Other specified postprocedural states: Secondary | ICD-10-CM

## 2021-01-06 DIAGNOSIS — M722 Plantar fascial fibromatosis: Secondary | ICD-10-CM

## 2021-01-07 ENCOUNTER — Encounter: Payer: Self-pay | Admitting: Podiatry

## 2021-01-11 NOTE — Progress Notes (Signed)
°  Subjective:  Patient ID: Brandon Montes, male    DOB: 06-19-1955,  MRN: [id number]  Chief Complaint  Patient presents with   Routine Post Op     POV #1 DOS 01/01/2021 TOPAZ PROCEDURE, PRP INJECTION, LT ACHILLES, EXCISION PLANTAR FIBROMA LT FOOT    65 y.o. male returns for post-op check.  Doing well pain is improving  Review of Systems: Negative except as noted in the HPI. Denies N/V/F/Ch.   Objective:  There were no vitals filed for this visit. There is no height or weight on file to calculate BMI. Constitutional Well developed. Well nourished.  Vascular Foot warm and well perfused. Capillary refill normal to all digits.  Calf is soft and supple, no posterior calf or knee pain, negative Homans' sign  Neurologic Normal speech. Oriented to person, place, and time. Epicritic sensation to light touch grossly present bilaterally.  Dermatologic Skin healing well without signs of infection. Skin edges well coapted without signs of infection.  Some bruising around the incision  Orthopedic: Tenderness to palpation noted about the surgical site.    Assessment:   1. Plantar fascial fibromatosis of left foot   2. Achilles tendinitis of left lower extremity   3. Post-operative state    Plan:  Patient was evaluated and treated and all questions answered.  S/p foot surgery left -Progressing as expected post-operatively. -XR: None taken -WB Status: NWB in CAM boot -Sutures: Removed at next visit. -Medications: No refills required -Foot redressed.  Return in about 2 weeks (around 01/20/2021) for suture removal, post op (no x-rays).

## 2021-01-20 ENCOUNTER — Other Ambulatory Visit: Payer: Self-pay

## 2021-01-20 ENCOUNTER — Ambulatory Visit (INDEPENDENT_AMBULATORY_CARE_PROVIDER_SITE_OTHER): Payer: BC Managed Care – PPO | Admitting: Podiatry

## 2021-01-20 ENCOUNTER — Encounter: Payer: BC Managed Care – PPO | Admitting: Podiatry

## 2021-01-20 DIAGNOSIS — M7662 Achilles tendinitis, left leg: Secondary | ICD-10-CM

## 2021-01-20 DIAGNOSIS — M722 Plantar fascial fibromatosis: Secondary | ICD-10-CM

## 2021-01-20 NOTE — Progress Notes (Signed)
°  Subjective:  Patient ID: Brandon Montes, male    DOB: 09-17-1955,  MRN: [id number]  Chief Complaint  Patient presents with   Routine Post Op      POV #2 DOS 01/01/2021 TOPAZ PROCEDURE, PRP INJECTION, LT ACHILLES, EXCISION PLANTAR FIBROMA LT FOOT    65 y.o. male returns for post-op check.  Doing well pain is improving  Review of Systems: Negative except as noted in the HPI. Denies N/V/F/Ch.   Objective:  There were no vitals filed for this visit. There is no height or weight on file to calculate BMI. Constitutional Well developed. Well nourished.  Vascular Foot warm and well perfused. Capillary refill normal to all digits.  Calf is soft and supple, no posterior calf or knee pain, negative Homans' sign  Neurologic Normal speech. Oriented to person, place, and time. Epicritic sensation to light touch grossly present bilaterally.  Dermatologic Skin healing well without signs of infection. Skin edges well coapted without signs of infection.  Some bruising around the incision  Orthopedic: Tenderness to palpation noted about the surgical site.    Assessment:   1. Plantar fascial fibromatosis of left foot   2. Achilles tendinitis of left lower extremity     Plan:  Patient was evaluated and treated and all questions answered.  S/p foot surgery left -Progressing as expected post-operatively. -XR: None taken -WB Status: WBAT in regular shoes when tolerated -Sutures: Removed today -Continue home therapy for Achilles   Return in about 3 weeks (around 02/10/2021) for post op (no x-rays).

## 2021-01-20 NOTE — Patient Instructions (Signed)

## 2021-01-27 ENCOUNTER — Ambulatory Visit: Payer: BC Managed Care – PPO | Admitting: Podiatry

## 2021-01-28 DIAGNOSIS — H2513 Age-related nuclear cataract, bilateral: Secondary | ICD-10-CM | POA: Diagnosis not present

## 2021-01-28 DIAGNOSIS — H43811 Vitreous degeneration, right eye: Secondary | ICD-10-CM | POA: Diagnosis not present

## 2021-02-10 ENCOUNTER — Ambulatory Visit (INDEPENDENT_AMBULATORY_CARE_PROVIDER_SITE_OTHER): Payer: BC Managed Care – PPO | Admitting: Podiatry

## 2021-02-10 ENCOUNTER — Other Ambulatory Visit: Payer: Self-pay

## 2021-02-10 DIAGNOSIS — M7662 Achilles tendinitis, left leg: Secondary | ICD-10-CM

## 2021-02-10 DIAGNOSIS — M722 Plantar fascial fibromatosis: Secondary | ICD-10-CM

## 2021-02-10 DIAGNOSIS — M21862 Other specified acquired deformities of left lower leg: Secondary | ICD-10-CM

## 2021-02-10 NOTE — Patient Instructions (Signed)

## 2021-02-14 NOTE — Progress Notes (Signed)
°  Subjective:  Patient ID: Brandon Montes, male    DOB: 07-02-1955,  MRN: [id number]  Chief Complaint  Patient presents with   Routine Post Op      POV #3 DOS 01/01/2021 TOPAZ PROCEDURE, PRP INJECTION, LT ACHILLES, EXCISION PLANTAR FIBROMA LT FOOT    66 y.o. male returns for post-op check.  Fibroma excision feels fine but the Achilles is still hurting, he is working in [place] right now Review of Systems: Negative except as noted in the HPI. Denies N/V/F/Ch.   Objective:  There were no vitals filed for this visit. There is no height or weight on file to calculate BMI. Constitutional Well developed. Well nourished.  Vascular Foot warm and well perfused. Capillary refill normal to all digits.  Calf is soft and supple, no posterior calf or knee pain, negative Homans' sign  Neurologic Normal speech. Oriented to person, place, and time. Epicritic sensation to light touch grossly present bilaterally.  Dermatologic Incision is well-healed and not hypertrophic  Orthopedic: Tenderness to palpation at the Achilles insertion    Assessment:   1. Plantar fascial fibromatosis of left foot   2. Achilles tendinitis of left lower extremity   3. Gastrocnemius equinus of left lower extremity      Plan:  Patient was evaluated and treated and all questions answered.  S/p foot surgery left -Progressing as expected post-operatively. -XR: None taken -WB Status: WBAT in regular shoes when tolerated -Sutures: Removed today -Continue home therapy for Achilles.  He is currently working in [employer].  We will see if we can find having some continued therapy with their.  Recommended using Voltaren and discussed the option of Celebrex which he will consider.  Return in about 6 weeks (around 03/24/2021) for re-check Achilles tendon.

## 2021-03-24 ENCOUNTER — Other Ambulatory Visit: Payer: Self-pay

## 2021-03-24 ENCOUNTER — Ambulatory Visit (INDEPENDENT_AMBULATORY_CARE_PROVIDER_SITE_OTHER): Payer: BC Managed Care – PPO | Admitting: Podiatry

## 2021-03-24 ENCOUNTER — Encounter: Payer: Self-pay | Admitting: Podiatry

## 2021-03-24 DIAGNOSIS — M21862 Other specified acquired deformities of left lower leg: Secondary | ICD-10-CM

## 2021-03-24 DIAGNOSIS — M62462 Contracture of muscle, left lower leg: Secondary | ICD-10-CM

## 2021-03-24 DIAGNOSIS — M722 Plantar fascial fibromatosis: Secondary | ICD-10-CM

## 2021-03-24 DIAGNOSIS — M7662 Achilles tendinitis, left leg: Secondary | ICD-10-CM

## 2021-03-24 MED ORDER — CELECOXIB 200 MG PO CAPS: 200.0000 mg | ORAL_CAPSULE | Freq: Two times a day (BID) | ORAL | 0 refills | Status: DC

## 2021-03-24 NOTE — Progress Notes (Signed)
?  Subjective:  ?Patient ID: Brandon Montes, male    DOB: 02/01/1955,  MRN: [id number] ? ?Chief Complaint  ?Patient presents with  ? Routine Post Op  ?  POV #3 DOS 01/01/2021 TOPAZ PROCEDURE, PRP INJECTION, LT ACHILLES, EXCISION PLANTAR FIBROMA LT FOOT  ? ? ?66 y.o. male returns for post-op check.  Still feels about the same.  The Achilles mostly bothers him when he is running on the treadmill which she has been able to do but hurts after.  Day-to-day activities do not bother him. ? ? ?Review of Systems: Negative except as noted in the HPI. Denies N/V/F/Ch. ? ? ?Objective:  ?There were no vitals filed for this visit. ?There is no height or weight on file to calculate BMI. ?Constitutional Well developed. ?Well nourished.  ?Vascular Foot warm and well perfused. ?Capillary refill normal to all digits.  Calf is soft and supple, no posterior calf or knee pain, negative Homans' sign  ?Neurologic Normal speech. ?Oriented to person, place, and time. ?Epicritic sensation to light touch grossly present bilaterally.  ?Dermatologic Incision is well-healed and not hypertrophic  ?Orthopedic: 5 out of 5 strength he has no tenderness to palpation directly.  ? ? ?Assessment:  ? ?1. Achilles tendinitis of left lower extremity   ?2. Plantar fascial fibromatosis of left foot   ?3. Gastrocnemius equinus of left lower extremity   ? ? ? ?Plan:  ?Patient was evaluated and treated and all questions answered. ? ?S/p foot surgery left ?-Has not improved much since last visit.  I think likely he may be near the maximum benefit.  His Achilles does not hurt with day-to-day activity but when running.  He is due to start softball season and hopefully it is manageable as he begins to increase his activity level.  I did prescribe him Celebrex to see if this is more helpful than the meloxicam for a better long-term option.  He will let me know how he is doing.  I will see him back on an as-needed basis at this point. ? ?Return if symptoms worsen or fail  to improve.  ?

## 2021-04-05 ENCOUNTER — Telehealth: Payer: Self-pay | Admitting: *Deleted

## 2021-04-05 MED ORDER — MELOXICAM 15 MG PO TABS: 15.0000 mg | ORAL_TABLET | Freq: Every day | ORAL | 3 refills | Status: DC

## 2021-04-05 NOTE — Telephone Encounter (Signed)
Patient is calling to request that his Celebrex be changed back to Meloxicam, not working as well. Please advise. ?

## 2021-04-05 NOTE — Telephone Encounter (Signed)
Patient has been notified and will pick up [facility]  in [place]

## 2021-04-27 DIAGNOSIS — M79672 Pain in left foot: Secondary | ICD-10-CM | POA: Diagnosis not present

## 2021-04-27 DIAGNOSIS — M67874 Other specified disorders of tendon, left ankle and foot: Secondary | ICD-10-CM | POA: Diagnosis not present

## 2021-04-27 DIAGNOSIS — M7660 Achilles tendinitis, unspecified leg: Secondary | ICD-10-CM | POA: Diagnosis not present

## 2021-05-19 NOTE — Progress Notes (Signed)
? ? ?I,Brandon Montes,acting as a Education administrator for [employer], PA-C.,have documented all relevant documentation on the behalf of Brandon Kirschner, PA-C,as directed by  Brandon Kirschner, PA-C while in the presence of Brandon Kirschner, PA-C. ? ? ?Complete physical exam ? ? ?Patient: Brandon Montes   DOB: 10-30-1955   66 y.o. Male  MRN: [id number] ?Visit Date: 05/20/2021 ? ?Today's healthcare provider: [name], PA-C  ? ?Cc. cpe ? ?Subjective  ?  ?Brandon Montes is a 66 y.o. male who presents today for a complete physical exam.  ?He reports consuming a general diet.  The patient reports he works out a little bit everyday as much as he can due to achilles tendon, mainly consisting of stretches and upper body for at least 30 minutes.   He generally feels well. He reports sleeping fairly well. He does not have additional problems to discuss today.  ? ?Past Medical History:  ?Diagnosis Date  ? Fatty liver   ? Gall stones   ? ?Past Surgical History:  ?Procedure Laterality Date  ? COLONOSCOPY WITH PROPOFOL N/A 05/04/2015  ? Procedure: COLONOSCOPY WITH PROPOFOL;  Surgeon: Lollie Sails, MD;  Location: [facility] ENDOSCOPY;  Service: Endoscopy;  Laterality: N/A;  ? CYST EXCISION    ? from right wrist  ? CYSTECTOMY    ? TONSILLECTOMY    ? VASECTOMY    ? ?Social History  ? ?Socioeconomic History  ? Marital status: Married  ?  Spouse name: Not on file  ? Number of children: 3  ? Years of education: Not on file  ? Highest education level: Not on file  ?Occupational History  ? Occupation: [employer]  ?Tobacco Use  ? Smoking status: Former  ? Smokeless tobacco: Former  ?Vaping Use  ? Vaping Use: Never used  ?Substance and Sexual Activity  ? Alcohol use: No  ?  Alcohol/week: 0.0 standard drinks  ? Drug use: No  ? Sexual activity: Not on file  ?Other Topics Concern  ? Not on file  ?Social History Narrative  ? Not on file  ? ?Social Determinants of Health  ? ?Financial Resource Strain: Not on file  ?Food Insecurity: Not on file   ?Transportation Needs: Not on file  ?Physical Activity: Not on file  ?Stress: Not on file  ?Social Connections: Not on file  ?Intimate Partner Violence: Not on file  ? ?Family Status  ?Relation Name Status  ? Sister  Alive  ? Daughter  Alive  ? Daughter  Alive  ? Daughter  Alive  ? Sister  Alive  ? Mother  Alive  ? Father  Alive  ? Brother  Alive  ? Brother  Alive  ? PGF  (Not Specified)  ? ?Family History  ?Problem Relation Age of Onset  ? Diabetes Father   ? Diabetes Paternal Grandfather   ? ?Allergies  ?Allergen Reactions  ? Shellfish-Derived Products Nausea And Vomiting  ?  ?Patient Care Team: ?Emelia Loron as PCP - General (Physician Assistant)  ? ?Medications: ?Outpatient Medications Prior to Visit  ?Medication Sig  ? [DISCONTINUED] gabapentin (NEURONTIN) 300 MG capsule Take 1 capsule (300 mg total) by mouth 3 (three) times daily for 7 days.  ? [DISCONTINUED] meloxicam (MOBIC) 15 MG tablet Take 1 tablet (15 mg total) by mouth daily. (Patient not taking: Reported on 05/20/2021)  ? ?No facility-administered medications prior to visit.  ? ? ?Review of Systems  ?Constitutional:  Negative for fatigue and fever.  ?Respiratory:  Negative for cough and  shortness of breath.   ?Cardiovascular:  Negative for chest pain, palpitations and leg swelling.  ?Genitourinary:  Positive for frequency.  ?Neurological:  Negative for dizziness and headaches.  ? ? Objective  ? ?  ?Blood pressure 132/82, pulse 73, temperature 97.7 ?F (36.5 ?C), temperature source Oral, height 6' (1.829 m), weight 221 lb 3.2 oz (100.3 kg), SpO2 100 %.  ? ? ?Physical Exam ?Constitutional:   ?   General: He is awake.  ?   Appearance: He is well-developed.  ?HENT:  ?   Head: Normocephalic.  ?   Right Ear: Tympanic membrane, ear canal and external ear normal.  ?   Left Ear: Tympanic membrane, ear canal and external ear normal.  ?   Nose: Nose normal. No congestion or rhinorrhea.  ?   Mouth/Throat:  ?   Mouth: Mucous membranes are moist.  ?    Pharynx: No oropharyngeal exudate or posterior oropharyngeal erythema.  ?Eyes:  ?   Pupils: Pupils are equal, round, and reactive to light.  ?Cardiovascular:  ?   Rate and Rhythm: Normal rate and regular rhythm.  ?   Heart sounds: Normal heart sounds.  ?Pulmonary:  ?   Effort: Pulmonary effort is normal.  ?   Breath sounds: Normal breath sounds.  ?Abdominal:  ?   General: There is no distension.  ?   Palpations: Abdomen is soft.  ?   Tenderness: There is no abdominal tenderness. There is no guarding.  ?Musculoskeletal:  ?   Cervical back: Normal range of motion.  ?   Right lower leg: No edema.  ?   Left lower leg: No edema.  ?Lymphadenopathy:  ?   Cervical: No cervical adenopathy.  ?Skin: ?   General: Skin is warm.  ?Neurological:  ?   Mental Status: He is alert and oriented to person, place, and time.  ?Psychiatric:     ?   Attention and Perception: Attention normal.     ?   Mood and Affect: Mood normal.     ?   Speech: Speech normal.     ?   Behavior: Behavior normal. Behavior is cooperative.  ?  ? ?Last depression screening scores ? ?  05/20/2021  ?  9:55 AM 08/09/2018  ? 10:12 AM 07/10/2018  ?  8:28 AM  ?PHQ 2/9 Scores  ?PHQ - 2 Score 0 0 0  ?PHQ- 9 Score 2 0   ? ?Last fall risk screening ? ?  05/20/2021  ?  9:55 AM  ?Fall Risk   ?Falls in the past year? 0  ?Number falls in past yr: 0  ?Injury with Fall? 0  ?Risk for fall due to : No Fall Risks  ? ?Last Audit-C alcohol use screening ? ?  05/20/2021  ?  9:55 AM  ?Alcohol Use Disorder Test (AUDIT)  ?1. How often do you have a drink containing alcohol? 0  ?2. How many drinks containing alcohol do you have on a typical day when you are drinking? 0  ?3. How often do you have six or more drinks on one occasion? 0  ?AUDIT-C Score 0  ? ?A score of 3 or more in women, and 4 or more in men indicates increased risk for alcohol abuse, EXCEPT if all of the points are from question 1  ? ?No results found for any visits on 05/20/21. ? Assessment & Plan  ?  ?Routine Health  Maintenance and Physical Exam ? ?Exercise Activities and Dietary recommendations ?--balanced diet high  in fiber and protein, low in sugars, carbs, fats. ?--physical activity/exercise 30 minutes 3-5 times a week  ? ? ?Immunization History  ?Administered Date(s) Administered  ? Influenza,inj,Quad PF,6+ Mos 04/08/2016  ? Moderna Covid-19 Vaccine Bivalent Booster 52yr & up 11/29/2020  ? Moderna Sars-Covid-2 Vaccination 05/03/2019, 05/31/2019, 01/12/2020  ? Td 04/02/2015  ? Tdap 08/11/2005  ? ? ?Health Maintenance  ?Topic Date Due  ? Zoster Vaccines- Shingrix (1 of 2) 08/19/2021 (Originally 04/18/2007)  ? INFLUENZA VACCINE  08/24/2021  ? TETANUS/TDAP  04/01/2025  ? COLONOSCOPY (Pts [id number] coverage will need to be confirmed)  05/27/2027  ? COVID-19 Vaccine  Completed  ? Hepatitis C Screening  Completed  ? HIV Screening  Completed  ? HPV VACCINES  Aged Out  ? ? ?Discussed health benefits of physical activity, and encouraged him to engage in regular exercise appropriate for his age and condition. ? ?Problem List Items Addressed This Visit   ? ?  ? Other  ? Elevated blood pressure reading in office without diagnosis of hypertension  ?  Repeat improved in office ?Advised pt to check at home ? ?  ?  ? Relevant Orders  ? Comprehensive metabolic panel  ? ?Other Visit Diagnoses   ? ? Encounter for health maintenance examination    -  Primary  ? Relevant Orders  ? Comprehensive metabolic panel  ? CBC  ? TSH + free T4  ? Encounter for prostate cancer screening      ? Relevant Orders  ? PSA  ? Class 1 obesity with body mass index (BMI) of 30.0 to 30.9 in adult, unspecified obesity type, unspecified whether serious comorbidity present      ? Relevant Orders  ? CBC  ? Lipid panel  ? TSH + free T4  ? HgB A1c  ? ?  ?  ? ?Return in about 1 year (around 05/21/2022) for CPE.  ?  ? ?I, LiMikey KirschnerPA-C have reviewed all documentation for this visit. The documentation on  05/20/2021  for the exam, diagnosis, procedures, and  orders are all accurate and complete. ? ?LiMikey KirschnerPA-C ?[address] [id number] ?[id number]: [phone]: [phone]? ? ?[facility] ? ? ?

## 2021-05-20 ENCOUNTER — Encounter: Payer: Self-pay | Admitting: Physician Assistant

## 2021-05-20 ENCOUNTER — Ambulatory Visit (INDEPENDENT_AMBULATORY_CARE_PROVIDER_SITE_OTHER): Payer: BC Managed Care – PPO | Admitting: Physician Assistant

## 2021-05-20 VITALS — BP 132/82 | HR 73 | Temp 97.7°F | Ht 72.0 in | Wt 221.2 lb

## 2021-05-20 DIAGNOSIS — R030 Elevated blood-pressure reading, without diagnosis of hypertension: Secondary | ICD-10-CM | POA: Diagnosis not present

## 2021-05-20 DIAGNOSIS — Z6830 Body mass index (BMI) 30.0-30.9, adult: Secondary | ICD-10-CM | POA: Diagnosis not present

## 2021-05-20 DIAGNOSIS — Z125 Encounter for screening for malignant neoplasm of prostate: Secondary | ICD-10-CM | POA: Diagnosis not present

## 2021-05-20 DIAGNOSIS — Z0000 Encounter for general adult medical examination without abnormal findings: Secondary | ICD-10-CM

## 2021-05-20 DIAGNOSIS — E669 Obesity, unspecified: Secondary | ICD-10-CM | POA: Diagnosis not present

## 2021-05-20 DIAGNOSIS — M6788 Other specified disorders of synovium and tendon, other site: Secondary | ICD-10-CM | POA: Insufficient documentation

## 2021-05-20 NOTE — Assessment & Plan Note (Signed)
Repeat improved in office ?Advised pt to check at home ?

## 2021-05-21 LAB — HEMOGLOBIN A1C
Est. average glucose Bld gHb Est-mCnc: 128 mg/dL
Hgb A1c MFr Bld: 6.1 % — ABNORMAL HIGH (ref 4.8–5.6)

## 2021-05-21 LAB — LIPID PANEL
Chol/HDL Ratio: 3.4 ratio (ref 0.0–5.0)
Cholesterol, Total: 126 mg/dL (ref 100–199)
HDL: 37 mg/dL — ABNORMAL LOW (ref >39)
LDL Chol Calc (NIH): 66 mg/dL (ref 0–99)
Triglycerides: 129 mg/dL (ref 0–149)
VLDL Cholesterol Cal: 23 mg/dL (ref 5–40)

## 2021-05-21 LAB — COMPREHENSIVE METABOLIC PANEL
ALT: 30 IU/L (ref 0–44)
AST: 23 IU/L (ref 0–40)
Albumin/Globulin Ratio: 2 (ref 1.2–2.2)
Albumin: 4.5 g/dL (ref 3.8–4.8)
Alkaline Phosphatase: 74 IU/L (ref 44–121)
BUN/Creatinine Ratio: 17 (ref 10–24)
BUN: 16 mg/dL (ref 8–27)
Bilirubin Total: 0.4 mg/dL (ref 0.0–1.2)
CO2: 25 mmol/L (ref 20–29)
Calcium: 9.1 mg/dL (ref 8.6–10.2)
Chloride: 103 mmol/L (ref 96–106)
Creatinine, Ser: 0.94 mg/dL (ref 0.76–1.27)
Globulin, Total: 2.2 g/dL (ref 1.5–4.5)
Glucose: 95 mg/dL (ref 70–99)
Potassium: 4.4 mmol/L (ref 3.5–5.2)
Sodium: 140 mmol/L (ref 134–144)
Total Protein: 6.7 g/dL (ref 6.0–8.5)
eGFR: 91 mL/min/{1.73_m2} (ref >59)

## 2021-05-21 LAB — CBC
Hematocrit: 43.1 % (ref 37.5–51.0)
Hemoglobin: 14.1 g/dL (ref 13.0–17.7)
MCH: 27.6 pg (ref 26.6–33.0)
MCHC: 32.7 g/dL (ref 31.5–35.7)
MCV: 85 fL (ref 79–97)
Platelets: 194 10*3/uL (ref 150–450)
RBC: 5.1 x10E6/uL (ref 4.14–5.80)
RDW: 13 % (ref 11.6–15.4)
WBC: 5.6 10*3/uL (ref 3.4–10.8)

## 2021-05-21 LAB — PSA: Prostate Specific Ag, Serum: 0.4 ng/mL (ref 0.0–4.0)

## 2021-05-21 LAB — TSH+FREE T4
Free T4: 1.06 ng/dL (ref 0.82–1.77)
TSH: 1.7 u[IU]/mL (ref 0.450–4.500)

## 2021-05-24 ENCOUNTER — Encounter: Payer: Self-pay | Admitting: Physician Assistant

## 2021-05-24 DIAGNOSIS — R7303 Prediabetes: Secondary | ICD-10-CM | POA: Insufficient documentation

## 2021-05-27 DIAGNOSIS — M25572 Pain in left ankle and joints of left foot: Secondary | ICD-10-CM | POA: Diagnosis not present

## 2021-05-27 DIAGNOSIS — R269 Unspecified abnormalities of gait and mobility: Secondary | ICD-10-CM | POA: Diagnosis not present

## 2021-05-28 DIAGNOSIS — M67874 Other specified disorders of tendon, left ankle and foot: Secondary | ICD-10-CM | POA: Diagnosis not present

## 2021-06-02 DIAGNOSIS — M67874 Other specified disorders of tendon, left ankle and foot: Secondary | ICD-10-CM | POA: Diagnosis not present

## 2021-06-10 DIAGNOSIS — M25572 Pain in left ankle and joints of left foot: Secondary | ICD-10-CM | POA: Diagnosis not present

## 2021-06-10 DIAGNOSIS — R269 Unspecified abnormalities of gait and mobility: Secondary | ICD-10-CM | POA: Diagnosis not present

## 2021-06-22 DIAGNOSIS — M25572 Pain in left ankle and joints of left foot: Secondary | ICD-10-CM | POA: Diagnosis not present

## 2021-06-22 DIAGNOSIS — R269 Unspecified abnormalities of gait and mobility: Secondary | ICD-10-CM | POA: Diagnosis not present

## 2021-07-07 DIAGNOSIS — R269 Unspecified abnormalities of gait and mobility: Secondary | ICD-10-CM | POA: Diagnosis not present

## 2021-07-07 DIAGNOSIS — M25572 Pain in left ankle and joints of left foot: Secondary | ICD-10-CM | POA: Diagnosis not present

## 2021-07-16 DIAGNOSIS — M67874 Other specified disorders of tendon, left ankle and foot: Secondary | ICD-10-CM | POA: Diagnosis not present

## 2021-07-16 DIAGNOSIS — M7660 Achilles tendinitis, unspecified leg: Secondary | ICD-10-CM | POA: Diagnosis not present

## 2021-07-22 DIAGNOSIS — M25572 Pain in left ankle and joints of left foot: Secondary | ICD-10-CM | POA: Diagnosis not present

## 2021-07-22 DIAGNOSIS — R269 Unspecified abnormalities of gait and mobility: Secondary | ICD-10-CM | POA: Diagnosis not present

## 2021-08-20 DIAGNOSIS — M25572 Pain in left ankle and joints of left foot: Secondary | ICD-10-CM | POA: Diagnosis not present

## 2021-08-20 DIAGNOSIS — R269 Unspecified abnormalities of gait and mobility: Secondary | ICD-10-CM | POA: Diagnosis not present

## 2022-01-18 IMAGING — CR DG ABDOMEN ACUTE W/ 1V CHEST
1 series · 4 of 4 positions shown · non-contrast
Comparison: 07/28/2020, 07/27/2020

CLINICAL DATA: 65-year-old male with history of small-bowel
obstruction.

EXAM:
DG ABDOMEN ACUTE WITH 1 VIEW CHEST

[Series 1: dg abd acute 2+v w 1v chest · 0.14mm/px · 4 of 4 slices shown]
[im 1/4]
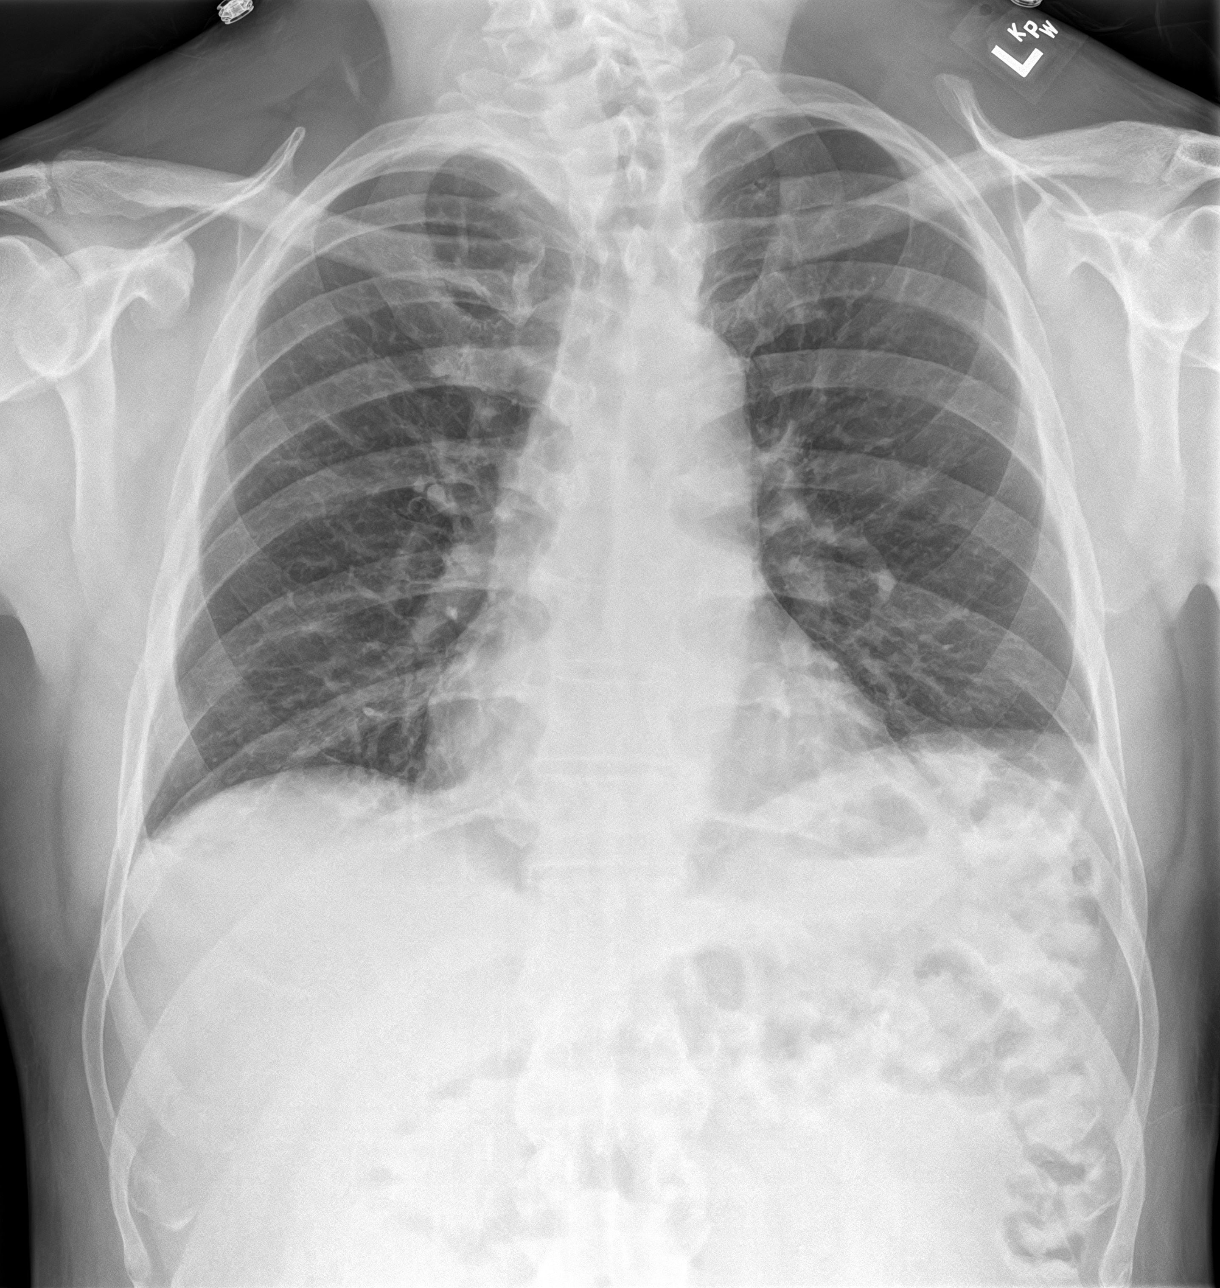
[im 2/4]
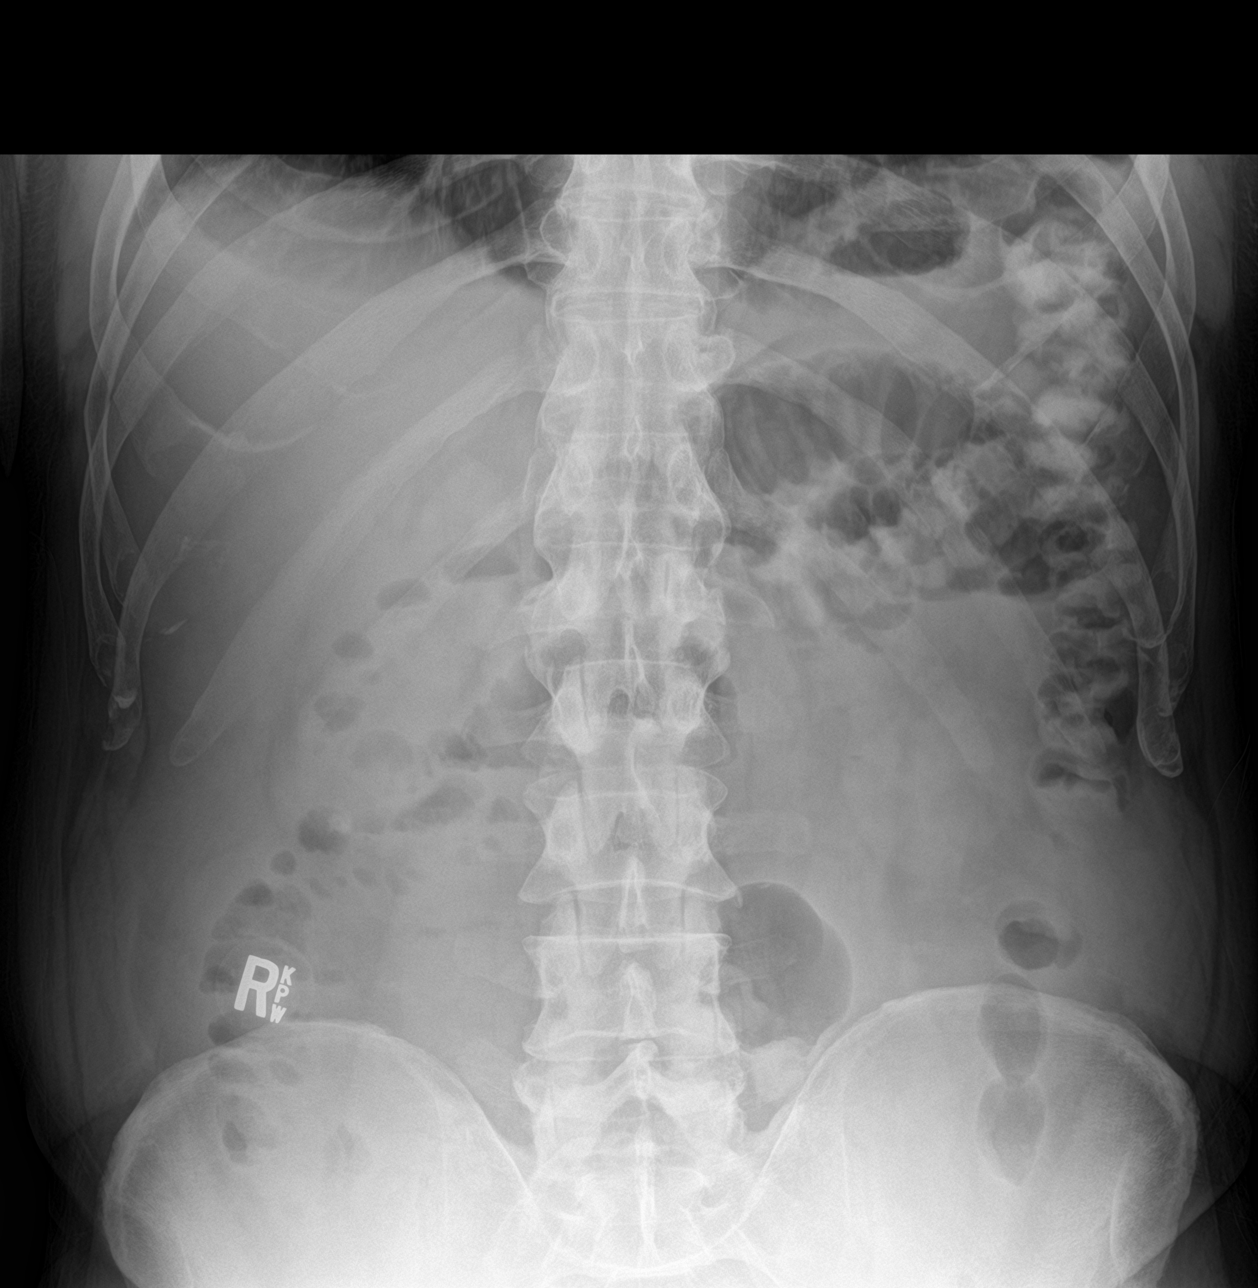
[im 3/4]
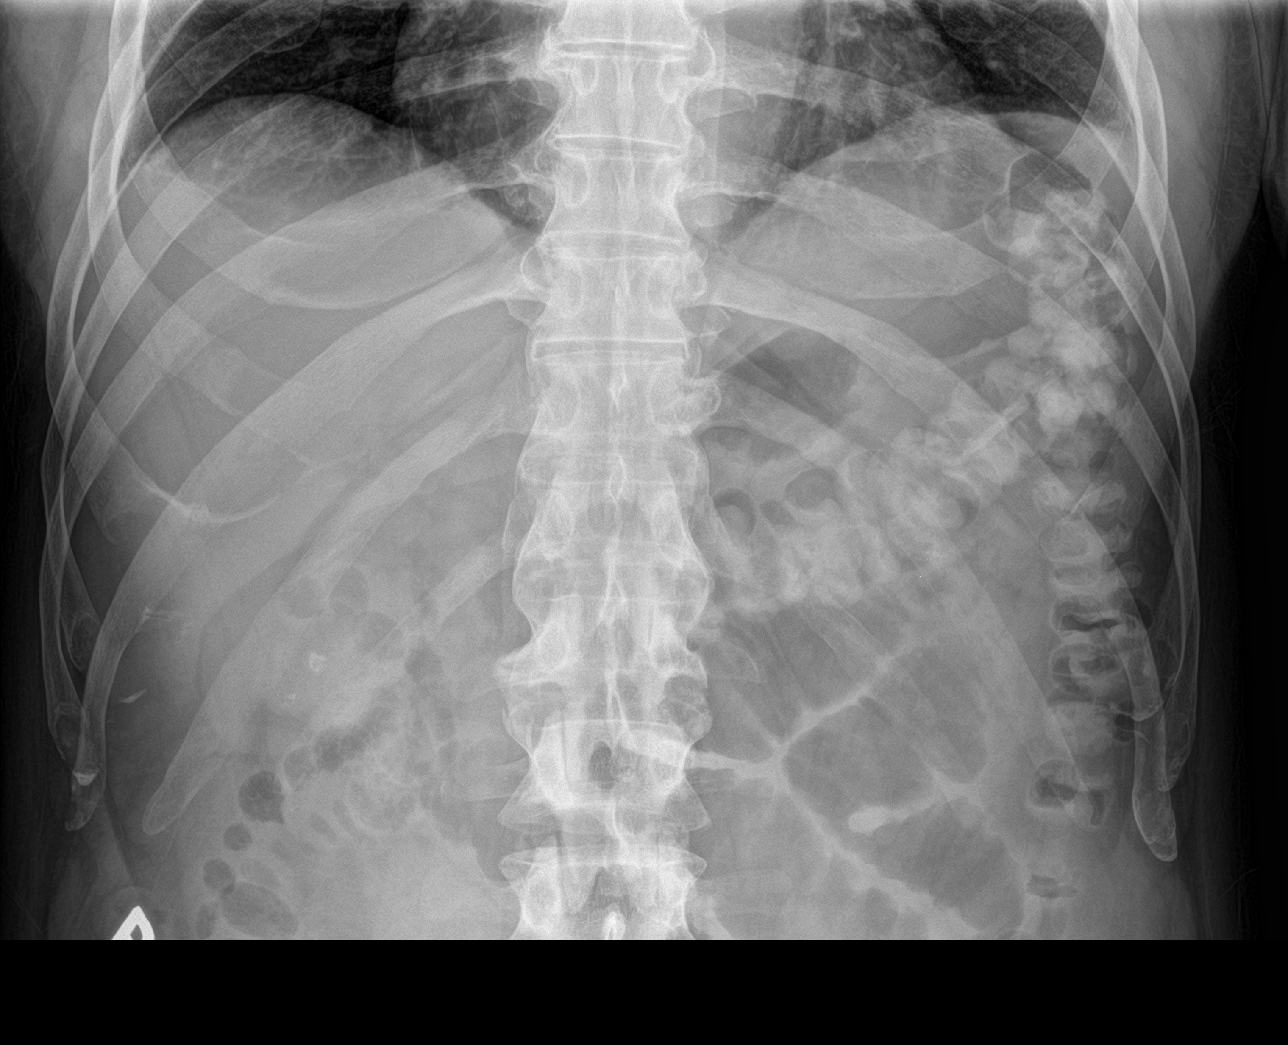
[im 4/4]
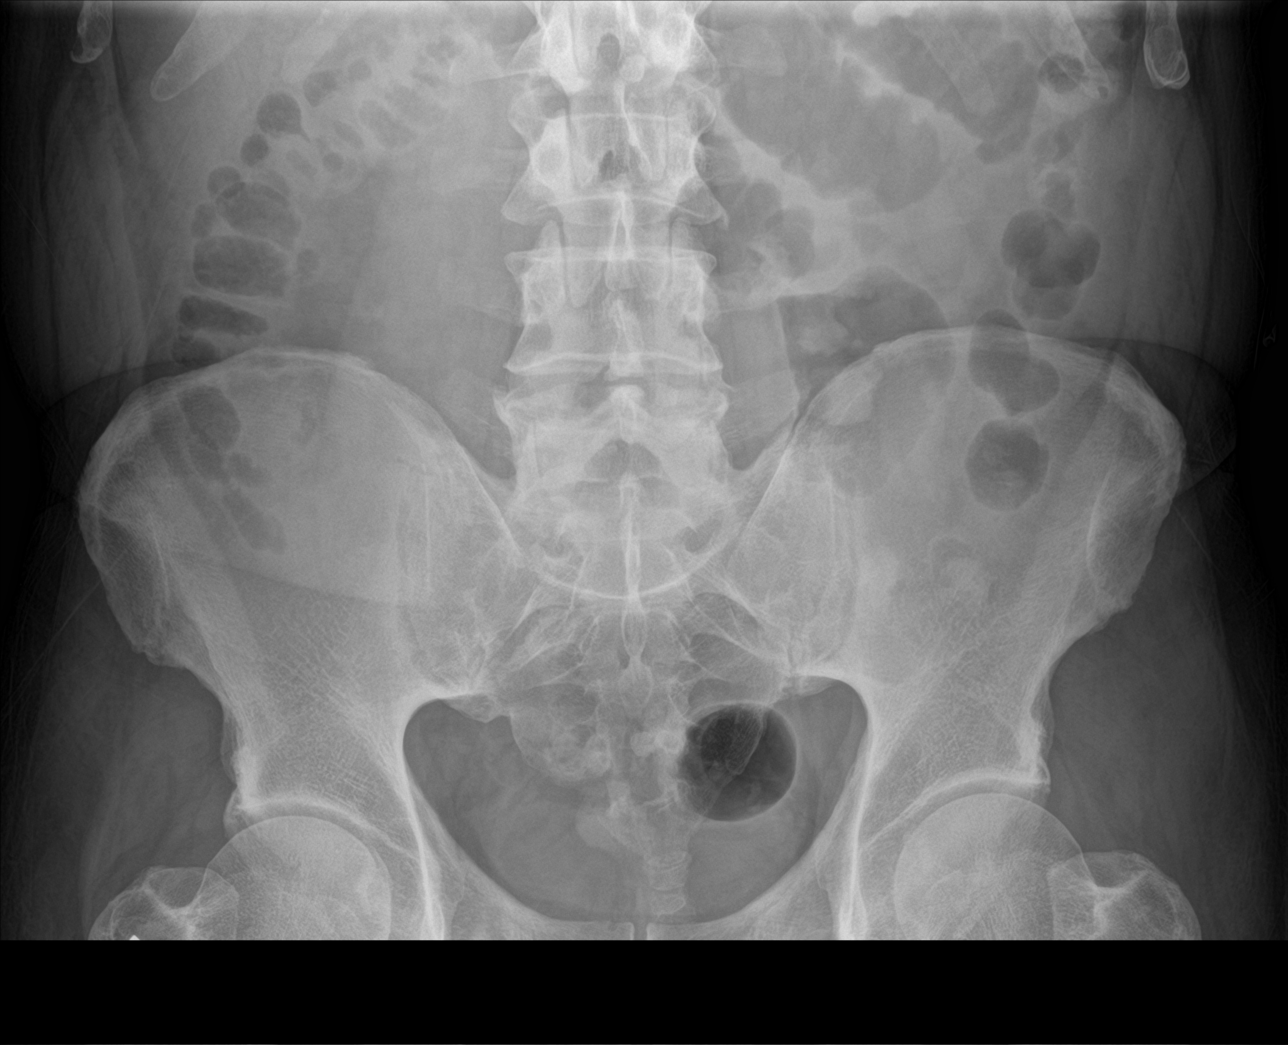

[4 of 4 positions shown; findings below may reference images not displayed]

FINDINGS: Similar appearing mild gaseous distension of loops of small bowel in
the left upper quadrant. No abnormal air-fluid levels. There is
interval propagation of contrast material through the colon which
contains gas. No pneumoperitoneum. Punctate calcification in the
right upper quadrant compatible with calcified gallstone on recent
comparison CT. No additional abnormal radiographic calcifications.
No acute osseous abnormality.
IMPRESSION: 1. Resolving small bowel obstruction.
2. Cholelithiasis.

## 2022-02-28 ENCOUNTER — Encounter: Payer: Self-pay | Admitting: Physician Assistant

## 2022-04-02 DIAGNOSIS — R519 Headache, unspecified: Secondary | ICD-10-CM | POA: Diagnosis not present

## 2022-04-02 DIAGNOSIS — I10 Essential (primary) hypertension: Secondary | ICD-10-CM | POA: Diagnosis not present

## 2022-04-08 ENCOUNTER — Ambulatory Visit (INDEPENDENT_AMBULATORY_CARE_PROVIDER_SITE_OTHER): Payer: BC Managed Care – PPO | Admitting: Physician Assistant

## 2022-04-08 ENCOUNTER — Encounter: Payer: Self-pay | Admitting: Physician Assistant

## 2022-04-08 VITALS — BP 134/96 | HR 74 | Temp 97.9°F | Ht 72.0 in | Wt 222.4 lb

## 2022-04-08 DIAGNOSIS — F419 Anxiety disorder, unspecified: Secondary | ICD-10-CM | POA: Insufficient documentation

## 2022-04-08 DIAGNOSIS — I10 Essential (primary) hypertension: Secondary | ICD-10-CM | POA: Diagnosis not present

## 2022-04-08 DIAGNOSIS — R519 Headache, unspecified: Secondary | ICD-10-CM | POA: Diagnosis not present

## 2022-04-08 MED ORDER — LOSARTAN POTASSIUM 25 MG PO TABS: 25.0000 mg | ORAL_TABLET | Freq: Every day | ORAL | 1 refills | Status: DC

## 2022-04-08 MED ORDER — SERTRALINE HCL 50 MG PO TABS: 50.0000 mg | ORAL_TABLET | Freq: Every day | ORAL | 3 refills | Status: DC

## 2022-04-08 NOTE — Assessment & Plan Note (Addendum)
Discussed likely multifactorial Start with losartan 25 mg, continue monitoring pressure Advised warning signs of high BP-- severe headache, vision changes, chest pain F/u 4-6 weeks

## 2022-04-08 NOTE — Assessment & Plan Note (Signed)
May be sinus in nature-- recommend taking antihistamine daily

## 2022-04-08 NOTE — Progress Notes (Signed)
I,J'ya E Hunter,acting as a scribe for [employer], PA-C.,have documented all relevant documentation on the behalf of [name], PA-C,as directed by  [name], PA-C while in the presence of [name], PA-C.   Established patient visit   Patient: Brandon Montes   DOB: July 14, 1955   67 y.o. Male  MRN: [id number] Visit Date: 04/08/2022  Today's healthcare provider: [name], PA-C   Chief Complaint  Patient presents with   Hypertension    [facility] Majestic to ED on March 9th, 2024   Headache   Anxiety   Subjective    HPI  Pt reports he was in [place] for his father's funeral for the last two weeks. He was with family and eating food that was not typical for him. He reports having some headaches behind his eyes, taking exedrin PRN. The night after the wake he felt flushed, with a worse headache than normal. He checked his BP and it was 170/104. He went to the ED, reports all their testing was normal. We will get a release for information. Reports checking his BP since then-- 130s/90s, but then sometimes 160s/90s.  Reports headache has persisted, but does come and go.  Pt also reports high levels of anxiety and worry. He admits to feeling random moments of anxiety, a shaky or empty feeling in his chest. Describes an event at a ball game, in crowds. He worries about worse case scenarios often.   Medications: No outpatient medications prior to visit.   No facility-administered medications prior to visit.    Review of Systems  Constitutional:  Negative for fatigue and fever.  Respiratory:  Negative for cough and shortness of breath.   Cardiovascular:  Negative for chest pain, palpitations and leg swelling.  Neurological:  Positive for headaches. Negative for dizziness.      Objective    BP (!) 134/96 (BP Location: Right Arm, Patient Position: Sitting, Cuff Size: Normal)   Pulse 74   Temp 97.9 F (36.6 C) (Oral)   Ht 6' (1.829 m)   Wt 222 lb 6.4 oz (100.9  kg)   SpO2 100%   BMI 30.16 kg/m    Physical Exam Constitutional:      General: He is awake.     Appearance: He is well-developed.  HENT:     Head: Normocephalic.  Eyes:     Conjunctiva/sclera: Conjunctivae normal.  Cardiovascular:     Rate and Rhythm: Normal rate and regular rhythm.     Heart sounds: Normal heart sounds.  Pulmonary:     Effort: Pulmonary effort is normal.     Breath sounds: Normal breath sounds.  Skin:    General: Skin is warm.  Neurological:     Mental Status: He is alert and oriented to person, place, and time.  Psychiatric:        Attention and Perception: Attention normal.        Mood and Affect: Mood normal.        Speech: Speech normal.        Behavior: Behavior is cooperative.     No results found for any visits on 04/08/22.  Assessment & Plan     Problem List Items Addressed This Visit       Cardiovascular and Mediastinum   Primary hypertension - Primary    Discussed likely multifactorial Start with losartan 25 mg, continue monitoring pressure Advised warning signs of high BP-- severe headache, vision changes, chest pain F/u 4-6 weeks  Relevant Medications   losartan (COZAAR) 25 MG tablet     Other   Anxiety    Discussed at length. Pt would like to try medication -- rx zoloft 50 mg daily. Advised SE F/u 4-6 weeks      Relevant Medications   sertraline (ZOLOFT) 50 MG tablet   Acute intractable headache    May be sinus in nature-- recommend taking antihistamine daily       Relevant Medications   sertraline (ZOLOFT) 50 MG tablet     Return in about 4 weeks (around 05/06/2022) for hypertension, anxiety.      I, [name], PA-C have reviewed all documentation for this visit. The documentation on  04/08/22  for the exam, diagnosis, procedures, and orders are all accurate and complete.  [name], PA-C [facility] [address] [place], [place], [address] Office: [phone] Fax:  [place]

## 2022-04-08 NOTE — Assessment & Plan Note (Signed)
Discussed at length. Pt would like to try medication -- rx zoloft 50 mg daily. Advised SE F/u 4-6 weeks

## 2022-04-30 ENCOUNTER — Other Ambulatory Visit: Payer: Self-pay | Admitting: Physician Assistant

## 2022-04-30 DIAGNOSIS — F419 Anxiety disorder, unspecified: Secondary | ICD-10-CM

## 2022-05-03 ENCOUNTER — Encounter: Payer: Self-pay | Admitting: Physician Assistant

## 2022-05-03 ENCOUNTER — Other Ambulatory Visit: Payer: Self-pay | Admitting: Physician Assistant

## 2022-05-03 DIAGNOSIS — F419 Anxiety disorder, unspecified: Secondary | ICD-10-CM

## 2022-05-03 MED ORDER — ESCITALOPRAM OXALATE 10 MG PO TABS: 10.0000 mg | ORAL_TABLET | Freq: Every day | ORAL | 1 refills | Status: DC

## 2022-05-23 NOTE — Progress Notes (Unsigned)
I,[name],acting as a Neurosurgeon for [employer], PA-C.,have documented all relevant documentation on the behalf of Brandon Ferguson, PA-C,as directed by  Brandon Ferguson, PA-C while in the presence of Brandon Ferguson, PA-C.   Complete physical exam   Patient: Brandon Montes   DOB: Jun 13, 1955   67 y.o. Male  MRN: [id number] Visit Date: 05/24/2022  Today's healthcare provider: [name], PA-C   Cc. cpe  Subjective    Brandon Montes is a 67 y.o. male who presents today for a complete physical exam.  He reports consuming a general diet.  The patient reports exercising four to five times a week for at least 30 minutes by wither walking at the park or doing some weights at home.  He generally feels well. He reports sleeping well. He does not have additional problems to discuss today.  HPI   Declines pneumonia vaccine.  Past Medical History:  Diagnosis Date   Fatty liver    Gall stones    Past Surgical History:  Procedure Laterality Date   COLONOSCOPY WITH PROPOFOL N/A 05/04/2015   Procedure: COLONOSCOPY WITH PROPOFOL;  Surgeon: Christena Deem, MD;  Location: [facility] ENDOSCOPY;  Service: Endoscopy;  Laterality: N/A;   CYST EXCISION     from right wrist   CYSTECTOMY     TONSILLECTOMY     VASECTOMY     Social History   Socioeconomic History   Marital status: Married    Spouse name: Not on file   Number of children: 3   Years of education: Not on file   Highest education level: Not on file  Occupational History   Occupation: [employer]  Tobacco Use   Smoking status: Former   Smokeless tobacco: Former  Building services engineer Use: Never used  Substance and Sexual Activity   Alcohol use: No    Alcohol/week: 0.0 standard drinks of alcohol   Drug use: No   Sexual activity: Not on file  Other Topics Concern   Not on file  Social History Narrative   Not on file   Social Determinants of Health   Financial Resource Strain: Not on file  Food Insecurity: Not on file   Transportation Needs: Not on file  Physical Activity: Not on file  Stress: Not on file  Social Connections: Not on file  Intimate Partner Violence: Not on file   Family Status  Relation Name Status   Mother  Alive   Father  Deceased   Sister  Alive   Sister  Alive   Brother  Alive   Brother  Alive   Daughter  Alive   Daughter  Alive   Daughter  Alive   PGF  (Not Specified)   Family History  Problem Relation Age of Onset   Diabetes Father    Renal cancer Father    Diabetes Paternal Grandfather    Allergies  Allergen Reactions   Shellfish-Derived Products Nausea And Vomiting    Patient Care Team: Brandon Ferguson, PA-C as PCP - General (Physician Assistant)   Medications: Outpatient Medications Prior to Visit  Medication Sig   escitalopram (LEXAPRO) 10 MG tablet Take 1 tablet (10 mg total) by mouth daily.   losartan (COZAAR) 25 MG tablet Take 1 tablet (25 mg total) by mouth daily.   No facility-administered medications prior to visit.    Review of Systems  Constitutional:  Negative for fatigue and fever.  Respiratory:  Negative for cough and shortness of breath.   Cardiovascular:  Negative for chest pain, palpitations and leg swelling.  Genitourinary:  Positive for frequency.  Neurological:  Negative for dizziness and headaches.     Objective    BP 138/83 (BP Location: Right Arm, Patient Position: Sitting, Cuff Size: Normal)   Pulse 61   Ht 6' (1.829 m)   Wt 221 lb 6.4 oz (100.4 kg)   SpO2 100%   BMI 30.03 kg/m   Physical Exam Constitutional:      General: He is awake.     Appearance: He is well-developed.  HENT:     Head: Normocephalic.     Right Ear: Tympanic membrane, ear canal and external ear normal.     Left Ear: Tympanic membrane, ear canal and external ear normal.     Nose: Nose normal. No congestion or rhinorrhea.     Mouth/Throat:     Mouth: Mucous membranes are moist.     Pharynx: No oropharyngeal exudate or posterior oropharyngeal  erythema.  Eyes:     Pupils: Pupils are equal, round, and reactive to light.  Cardiovascular:     Rate and Rhythm: Normal rate and regular rhythm.     Heart sounds: Normal heart sounds.  Pulmonary:     Effort: Pulmonary effort is normal.     Breath sounds: Normal breath sounds.  Abdominal:     General: There is no distension.     Palpations: Abdomen is soft.     Tenderness: There is no abdominal tenderness. There is no guarding.  Musculoskeletal:     Cervical back: Normal range of motion.     Right lower leg: No edema.     Left lower leg: No edema.  Lymphadenopathy:     Cervical: No cervical adenopathy.  Skin:    General: Skin is warm.  Neurological:     Mental Status: He is alert and oriented to person, place, and time.  Psychiatric:        Attention and Perception: Attention normal.        Mood and Affect: Mood normal.        Speech: Speech normal.        Behavior: Behavior normal. Behavior is cooperative.    Last depression screening scores    04/08/2022   10:31 AM 05/20/2021    9:55 AM 08/09/2018   10:12 AM  PHQ 2/9 Scores  PHQ - 2 Score 1 0 0  PHQ- 9 Score  2 0   Last fall risk screening    04/08/2022   10:31 AM  Fall Risk   Falls in the past year? 0  Number falls in past yr: 0   Last Audit-C alcohol use screening    04/08/2022   10:31 AM  Alcohol Use Disorder Test (AUDIT)  1. How often do you have a drink containing alcohol? 0   A score of 3 or more in women, and 4 or more in men indicates increased risk for alcohol abuse, EXCEPT if all of the points are from question 1   No results found for any visits on 05/24/22.  Assessment & Plan    Routine Health Maintenance and Physical Exam  Exercise Activities and Dietary recommendations --balanced diet high in fiber and protein, low in sugars, carbs, fats. --physical activity/exercise 30 minutes 3-5 times a week   Immunization History  Administered Date(s) Administered   Influenza,inj,Quad PF,6+ Mos  04/08/2016   Moderna Covid-19 Vaccine Bivalent Booster 46yrs & up 11/29/2020   Moderna Sars-Covid-2 Vaccination 05/03/2019, 05/31/2019, 01/12/2020  Td 04/02/2015   Tdap 08/11/2005   Zoster Recombinat (Shingrix) 05/24/2022    Health Maintenance  Topic Date Due   COVID-19 Vaccine (5 - 2023-24 season) 09/24/2021   Pneumonia Vaccine 71+ Years old (1 of 1 - PCV) Never done   Zoster Vaccines- Shingrix (2 of 2) 07/19/2022   INFLUENZA VACCINE  08/25/2022   DTaP/Tdap/Td (3 - Td or Tdap) 04/01/2025   COLONOSCOPY (Pts 45-49yrs Insurance coverage will need to be confirmed)  05/27/2027   Hepatitis C Screening  Completed   HIV Screening  Completed   HPV VACCINES  Aged Out    Discussed health benefits of physical activity, and encouraged him to engage in regular exercise appropriate for his age and condition.  Problem List Items Addressed This Visit       Cardiovascular and Mediastinum   Primary hypertension    Well controlled Managed with losartan 25 mg  F/u 6 mo         Other   Prediabetes    Repeat a1c      Relevant Orders   HgB A1c   Anxiety    Did not do well on zoloft 50 mg, switched to lexapro 10 mg ; improved Pt is also retiring now.  Continue dose F/u 6 mo       Other Visit Diagnoses     Annual physical exam    -  Primary   Relevant Orders   Lipid Profile   CBC with Differential/Platelet   Comprehensive metabolic panel   Prostate cancer screening       Relevant Orders   PSA   Low HDL (under 40)       Relevant Orders   Lipid Profile   Encounter for immunization       Relevant Orders   Varicella-zoster vaccine IM (Completed)        Return in about 6 months (around 11/23/2022) for chronic conditions.     I, Brandon Ferguson, PA-C have reviewed all documentation for this visit. The documentation on  05/24/22   for the exam, diagnosis, procedures, and orders are all accurate and complete.  Brandon Ferguson, PA-C [facility] [address] [place], [place], [address] Office: [phone] Fax: [phone]   [facility]

## 2022-05-24 ENCOUNTER — Encounter: Payer: Self-pay | Admitting: Physician Assistant

## 2022-05-24 ENCOUNTER — Ambulatory Visit (INDEPENDENT_AMBULATORY_CARE_PROVIDER_SITE_OTHER): Payer: BC Managed Care – PPO | Admitting: Physician Assistant

## 2022-05-24 VITALS — BP 138/83 | HR 61 | Ht 72.0 in | Wt 221.4 lb

## 2022-05-24 DIAGNOSIS — R7303 Prediabetes: Secondary | ICD-10-CM

## 2022-05-24 DIAGNOSIS — Z23 Encounter for immunization: Secondary | ICD-10-CM

## 2022-05-24 DIAGNOSIS — Z125 Encounter for screening for malignant neoplasm of prostate: Secondary | ICD-10-CM | POA: Diagnosis not present

## 2022-05-24 DIAGNOSIS — Z0000 Encounter for general adult medical examination without abnormal findings: Secondary | ICD-10-CM | POA: Diagnosis not present

## 2022-05-24 DIAGNOSIS — E786 Lipoprotein deficiency: Secondary | ICD-10-CM

## 2022-05-24 DIAGNOSIS — F419 Anxiety disorder, unspecified: Secondary | ICD-10-CM

## 2022-05-24 DIAGNOSIS — I10 Essential (primary) hypertension: Secondary | ICD-10-CM | POA: Diagnosis not present

## 2022-05-24 NOTE — Assessment & Plan Note (Signed)
Well controlled Managed with losartan 25 mg  F/u 6 mo

## 2022-05-24 NOTE — Assessment & Plan Note (Addendum)
Did not do well on zoloft 50 mg, switched to lexapro 10 mg ; improved Pt is also retiring now.  Continue dose F/u 6 mo

## 2022-05-24 NOTE — Assessment & Plan Note (Signed)
Repeat a1c 

## 2022-05-25 LAB — COMPREHENSIVE METABOLIC PANEL
ALT: 40 IU/L (ref 0–44)
AST: 27 IU/L (ref 0–40)
Albumin/Globulin Ratio: 2 (ref 1.2–2.2)
Albumin: 4.1 g/dL (ref 3.9–4.9)
Alkaline Phosphatase: 78 IU/L (ref 44–121)
BUN/Creatinine Ratio: 15 (ref 10–24)
BUN: 14 mg/dL (ref 8–27)
Bilirubin Total: 0.6 mg/dL (ref 0.0–1.2)
CO2: 24 mmol/L (ref 20–29)
Calcium: 9.3 mg/dL (ref 8.6–10.2)
Chloride: 102 mmol/L (ref 96–106)
Creatinine, Ser: 0.95 mg/dL (ref 0.76–1.27)
Globulin, Total: 2.1 g/dL (ref 1.5–4.5)
Glucose: 100 mg/dL — ABNORMAL HIGH (ref 70–99)
Potassium: 4.2 mmol/L (ref 3.5–5.2)
Sodium: 139 mmol/L (ref 134–144)
Total Protein: 6.2 g/dL (ref 6.0–8.5)
eGFR: 89 mL/min/{1.73_m2} (ref >59)

## 2022-05-25 LAB — HEMOGLOBIN A1C
Est. average glucose Bld gHb Est-mCnc: 128 mg/dL
Hgb A1c MFr Bld: 6.1 % — ABNORMAL HIGH (ref 4.8–5.6)

## 2022-05-25 LAB — CBC WITH DIFFERENTIAL/PLATELET
Basophils Absolute: 0 10*3/uL (ref 0.0–0.2)
Basos: 0 %
EOS (ABSOLUTE): 0.2 10*3/uL (ref 0.0–0.4)
Eos: 4 %
Hematocrit: 41.9 % (ref 37.5–51.0)
Hemoglobin: 13.9 g/dL (ref 13.0–17.7)
Immature Grans (Abs): 0 10*3/uL (ref 0.0–0.1)
Immature Granulocytes: 0 %
Lymphocytes Absolute: 1.6 10*3/uL (ref 0.7–3.1)
Lymphs: 30 %
MCH: 28.4 pg (ref 26.6–33.0)
MCHC: 33.2 g/dL (ref 31.5–35.7)
MCV: 86 fL (ref 79–97)
Monocytes Absolute: 0.6 10*3/uL (ref 0.1–0.9)
Monocytes: 12 %
Neutrophils Absolute: 3 10*3/uL (ref 1.4–7.0)
Neutrophils: 54 %
Platelets: 163 10*3/uL (ref 150–450)
RBC: 4.9 x10E6/uL (ref 4.14–5.80)
RDW: 13.1 % (ref 11.6–15.4)
WBC: 5.4 10*3/uL (ref 3.4–10.8)

## 2022-05-25 LAB — LIPID PANEL
Chol/HDL Ratio: 4.2 ratio (ref 0.0–5.0)
Cholesterol, Total: 158 mg/dL (ref 100–199)
HDL: 38 mg/dL — ABNORMAL LOW (ref >39)
LDL Chol Calc (NIH): 96 mg/dL (ref 0–99)
Triglycerides: 134 mg/dL (ref 0–149)
VLDL Cholesterol Cal: 24 mg/dL (ref 5–40)

## 2022-05-25 LAB — PSA: Prostate Specific Ag, Serum: 0.4 ng/mL (ref 0.0–4.0)

## 2022-09-29 ENCOUNTER — Other Ambulatory Visit: Payer: Self-pay | Admitting: Physician Assistant

## 2022-09-29 DIAGNOSIS — I10 Essential (primary) hypertension: Secondary | ICD-10-CM

## 2022-10-01 ENCOUNTER — Other Ambulatory Visit: Payer: Self-pay | Admitting: Physician Assistant

## 2022-10-01 DIAGNOSIS — F419 Anxiety disorder, unspecified: Secondary | ICD-10-CM

## 2022-10-12 DIAGNOSIS — H538 Other visual disturbances: Secondary | ICD-10-CM | POA: Diagnosis not present

## 2022-11-01 ENCOUNTER — Encounter: Payer: Self-pay | Admitting: Family Medicine

## 2022-11-01 ENCOUNTER — Ambulatory Visit (INDEPENDENT_AMBULATORY_CARE_PROVIDER_SITE_OTHER): Payer: PPO | Admitting: Family Medicine

## 2022-11-01 VITALS — BP 139/86 | HR 65 | Ht 72.0 in | Wt 218.0 lb

## 2022-11-01 DIAGNOSIS — E786 Lipoprotein deficiency: Secondary | ICD-10-CM | POA: Diagnosis not present

## 2022-11-01 DIAGNOSIS — Z23 Encounter for immunization: Secondary | ICD-10-CM | POA: Diagnosis not present

## 2022-11-01 DIAGNOSIS — K5000 Crohn's disease of small intestine without complications: Secondary | ICD-10-CM | POA: Diagnosis not present

## 2022-11-01 DIAGNOSIS — R7303 Prediabetes: Secondary | ICD-10-CM | POA: Diagnosis not present

## 2022-11-01 DIAGNOSIS — E782 Mixed hyperlipidemia: Secondary | ICD-10-CM | POA: Diagnosis not present

## 2022-11-01 DIAGNOSIS — I10 Essential (primary) hypertension: Secondary | ICD-10-CM

## 2022-11-01 DIAGNOSIS — F39 Unspecified mood [affective] disorder: Secondary | ICD-10-CM | POA: Diagnosis not present

## 2022-11-01 MED ORDER — LOSARTAN POTASSIUM 50 MG PO TABS: 50.0000 mg | ORAL_TABLET | Freq: Every day | ORAL | 1 refills | Status: DC

## 2022-11-01 MED ORDER — ESCITALOPRAM OXALATE 20 MG PO TABS: 20.0000 mg | ORAL_TABLET | Freq: Every day | ORAL | 1 refills | Status: DC

## 2022-11-01 NOTE — Progress Notes (Signed)
Established patient visit   Patient: Brandon Montes   DOB: 1955/05/10   67 y.o. Male  MRN: [id number] Visit Date: 11/01/2022  Today's healthcare provider: Jacky Kindle, FNP  Introduced to nurse practitioner role and practice setting.  All questions answered.  Discussed provider/patient relationship and expectations.  Subjective    HPI HPI     Medical Management of Chronic Issues    Additional comments: 6 month follow-up.       Last edited by Acey Lav, CMA on 11/01/2022  8:55 AM.      The 10-year ASCVD risk score (Arnett DK, et al., 2019) is: 16.8%  Brandon Montes, a patient with a history of prediabetes and hypertension, presents for a six-month follow-up. They report no specific problems. Their blood pressure is slightly elevated compared to their last visit, and they occasionally monitor it at home. They exercise daily. They also have a history of anxiety, which they report is still present, causing them to feel nervous and weak inside. They deny any symptoms of palpitations, sweating, or shortness of breath. They are currently on Lexapro for anxiety and Losartan for hypertension.  Medications: Outpatient Medications Prior to Visit  Medication Sig   [DISCONTINUED] escitalopram (LEXAPRO) 10 MG tablet TAKE 1 TABLET BY MOUTH EVERY DAY   [DISCONTINUED] losartan (COZAAR) 25 MG tablet TAKE 1 TABLET (25 MG TOTAL) BY MOUTH DAILY.   No facility-administered medications prior to visit.     Objective    BP 139/86 (BP Location: Right Arm, Patient Position: Sitting, Cuff Size: Large)   Pulse 65   Ht 6' (1.829 m)   Wt 218 lb (98.9 kg)   SpO2 100%   BMI 29.57 kg/m   Physical Exam Vitals and nursing note reviewed.  Constitutional:      Appearance: Normal appearance. He is overweight.  HENT:     Head: Normocephalic and atraumatic.  Cardiovascular:     Rate and Rhythm: Normal rate and regular rhythm.     Pulses: Normal pulses.     Heart sounds: Normal heart sounds.  Pulmonary:      Effort: Pulmonary effort is normal.     Breath sounds: Normal breath sounds.  Musculoskeletal:        General: Normal range of motion.     Cervical back: Normal range of motion.  Skin:    General: Skin is warm and dry.     Capillary Refill: Capillary refill takes less than 2 seconds.  Neurological:     General: No focal deficit present.     Mental Status: He is alert and oriented to person, place, and time. Mental status is at baseline.  Psychiatric:        Mood and Affect: Mood normal.        Behavior: Behavior normal.        Thought Content: Thought content normal.        Judgment: Judgment normal.     No results found for any visits on 11/01/22.  Assessment & Plan     Problem List Items Addressed This Visit       Cardiovascular and Mediastinum   Primary hypertension - Primary   Relevant Medications   losartan (COZAAR) 50 MG tablet   Other Relevant Orders   Comprehensive metabolic panel   CBC with Differential/Platelet   TSH     Digestive   Crohn's disease of small intestine without complications (HCC)     Other   Immunization due   Relevant Orders  Varicella-zoster vaccine IM (Completed)   Pneumococcal conjugate vaccine 20-valent (Completed)   Low HDL (under 40)   Relevant Orders   Lipid Panel With LDL/HDL Ratio   Mixed hyperlipidemia   Relevant Medications   losartan (COZAAR) 50 MG tablet   Mood disorder (HCC)   Relevant Medications   escitalopram (LEXAPRO) 20 MG tablet   Need for pneumococcal vaccine   Relevant Orders   Pneumococcal conjugate vaccine 20-valent (Completed)   Need for shingles vaccine   Relevant Orders   Varicella-zoster vaccine IM (Completed)   Prediabetes   Relevant Orders   Comprehensive metabolic panel   Hemoglobin A1c   Hypertension Blood pressure elevated compared to previous visit. Patient occasionally checks blood pressure at home. Discussed the potential benefits of increasing Losartan dosage to better control blood  pressure. -Increase Losartan from 25mg  to 50mg  daily. -Check blood pressure in 6 weeks.  Anxiety Patient reports persistent nervousness. Discussed the potential benefits of increasing Lexapro dosage. -Increase Lexapro dosage from 10mg  to 20mg  daily -Review mood and anxiety symptoms in 6 weeks.  Prediabetes Blood sugar previously elevated. Discussed the potential impact of blood sugar on vascular health and the transition to diabetes. -Repeat labs today to monitor blood sugar levels. -Continue to recommend balanced, lower carb meals. Smaller meal size, adding snacks. Choosing water as drink of choice and increasing purposeful exercise.  General Health Maintenance / Followup Plans -Return visit in 6 weeks to assess blood pressure control and mood/anxiety symptoms. -Appointment scheduled for Friday, December 16, 2022, at 8:00 AM.  I, Jacky Kindle, FNP, have reviewed all documentation for this visit. The documentation on 11/01/22 for the exam, diagnosis, procedures, and orders are all accurate and complete.  Jacky Kindle, FNP  [facility] [phone] (phone) [phone] (fax)  [facility]

## 2022-11-02 ENCOUNTER — Other Ambulatory Visit: Payer: Self-pay | Admitting: Family Medicine

## 2022-11-02 LAB — CBC WITH DIFFERENTIAL/PLATELET
Basophils Absolute: 0 10*3/uL (ref 0.0–0.2)
Basos: 0 %
EOS (ABSOLUTE): 0.1 10*3/uL (ref 0.0–0.4)
Eos: 2 %
Hematocrit: 44.8 % (ref 37.5–51.0)
Hemoglobin: 14.4 g/dL (ref 13.0–17.7)
Immature Grans (Abs): 0 10*3/uL (ref 0.0–0.1)
Immature Granulocytes: 0 %
Lymphocytes Absolute: 1.5 10*3/uL (ref 0.7–3.1)
Lymphs: 32 %
MCH: 29 pg (ref 26.6–33.0)
MCHC: 32.1 g/dL (ref 31.5–35.7)
MCV: 90 fL (ref 79–97)
Monocytes Absolute: 0.6 10*3/uL (ref 0.1–0.9)
Monocytes: 14 %
Neutrophils Absolute: 2.4 10*3/uL (ref 1.4–7.0)
Neutrophils: 52 %
Platelets: 155 10*3/uL (ref 150–450)
RBC: 4.96 x10E6/uL (ref 4.14–5.80)
RDW: 13.3 % (ref 11.6–15.4)
WBC: 4.7 10*3/uL (ref 3.4–10.8)

## 2022-11-02 LAB — COMPREHENSIVE METABOLIC PANEL
ALT: 25 [IU]/L (ref 0–44)
AST: 24 [IU]/L (ref 0–40)
Albumin: 4.3 g/dL (ref 3.9–4.9)
Alkaline Phosphatase: 82 [IU]/L (ref 44–121)
BUN/Creatinine Ratio: 17 (ref 10–24)
BUN: 18 mg/dL (ref 8–27)
Bilirubin Total: 0.8 mg/dL (ref 0.0–1.2)
CO2: 25 mmol/L (ref 20–29)
Calcium: 9.4 mg/dL (ref 8.6–10.2)
Chloride: 103 mmol/L (ref 96–106)
Creatinine, Ser: 1.06 mg/dL (ref 0.76–1.27)
Globulin, Total: 2.3 g/dL (ref 1.5–4.5)
Glucose: 97 mg/dL (ref 70–99)
Potassium: 5.1 mmol/L (ref 3.5–5.2)
Sodium: 141 mmol/L (ref 134–144)
Total Protein: 6.6 g/dL (ref 6.0–8.5)
eGFR: 78 mL/min/{1.73_m2} (ref >59)

## 2022-11-02 LAB — LIPID PANEL WITH LDL/HDL RATIO
Cholesterol, Total: 148 mg/dL (ref 100–199)
HDL: 40 mg/dL (ref >39)
LDL Chol Calc (NIH): 90 mg/dL (ref 0–99)
LDL/HDL Ratio: 2.3 {ratio} (ref 0.0–3.6)
Triglycerides: 93 mg/dL (ref 0–149)
VLDL Cholesterol Cal: 18 mg/dL (ref 5–40)

## 2022-11-02 LAB — TSH: TSH: 0.705 u[IU]/mL (ref 0.450–4.500)

## 2022-11-02 LAB — HEMOGLOBIN A1C
Est. average glucose Bld gHb Est-mCnc: 126 mg/dL
Hgb A1c MFr Bld: 6 % — ABNORMAL HIGH (ref 4.8–5.6)

## 2022-11-02 MED ORDER — ROSUVASTATIN CALCIUM 20 MG PO TABS: 20.0000 mg | ORAL_TABLET | Freq: Every day | ORAL | 3 refills | Status: DC

## 2022-11-23 ENCOUNTER — Ambulatory Visit: Payer: BC Managed Care – PPO | Admitting: Physician Assistant

## 2022-12-16 ENCOUNTER — Ambulatory Visit (INDEPENDENT_AMBULATORY_CARE_PROVIDER_SITE_OTHER): Payer: PPO | Admitting: Family Medicine

## 2022-12-16 ENCOUNTER — Encounter: Payer: Self-pay | Admitting: Family Medicine

## 2022-12-16 VITALS — BP 134/83 | HR 60 | Ht 72.0 in | Wt 226.0 lb

## 2022-12-16 DIAGNOSIS — N529 Male erectile dysfunction, unspecified: Secondary | ICD-10-CM | POA: Insufficient documentation

## 2022-12-16 DIAGNOSIS — R7303 Prediabetes: Secondary | ICD-10-CM | POA: Diagnosis not present

## 2022-12-16 DIAGNOSIS — F39 Unspecified mood [affective] disorder: Secondary | ICD-10-CM | POA: Diagnosis not present

## 2022-12-16 DIAGNOSIS — I10 Essential (primary) hypertension: Secondary | ICD-10-CM

## 2022-12-16 NOTE — Assessment & Plan Note (Signed)
Chronic, remains borderline Continue losartan 50 Continue diet and exercise program with low salt diet Defer changes at this time

## 2022-12-16 NOTE — Assessment & Plan Note (Signed)
Check hormones today given complaints of shorter acting erection Continue to monitor side effects from lexapro adjustment  Advised blood pressure, HLD and diabetes can all affect small vessels and provide similar s/s

## 2022-12-16 NOTE — Assessment & Plan Note (Signed)
Chronic, stable Continue to recommend balanced, lower carb meals. Smaller meal size, adding snacks. Choosing water as drink of choice and increasing purposeful exercise. Defer A1c to 3 month f/u

## 2022-12-16 NOTE — Progress Notes (Signed)
Established patient visit   Patient: Brandon Montes   DOB: February 12, 1955   67 y.o. Male  MRN: [id number] Visit Date: 12/16/2022  Today's healthcare provider: Jacky Kindle, FNP  Re Introduced to nurse practitioner role and practice setting.  All questions answered.  Discussed provider/patient relationship and expectations.  Subjective    HPI HPI   F/u Form need to be complete-[employer]. Last edited by Shelly Bombard, CMA on 12/16/2022  8:06 AM.     Hypertension, follow-up  BP Readings from Last 3 Encounters:  12/16/22 134/83  11/01/22 139/86  05/24/22 138/83   Wt Readings from Last 3 Encounters:  12/16/22 226 lb (102.5 kg)  11/01/22 218 lb (98.9 kg)  05/24/22 221 lb 6.4 oz (100.4 kg)     He was last seen for hypertension 6 weeks ago.  BP at that visit was 139/86. Management since that visit included increase in losartan from 25 to 50 mg.  He reports excellent compliance with treatment. He is having side effects. Complaints of shorter lasting erection. He is following a Regular diet. He is exercising. He does not smoke.  Use of agents associated with hypertension: none.   Outside blood pressures are not being checked.  Symptoms: No chest pain No chest pressure  No palpitations No syncope  No dyspnea No orthopnea  No paroxysmal nocturnal dyspnea No lower extremity edema   Pertinent labs Lab Results  Component Value Date   CHOL 148 11/01/2022   HDL 40 11/01/2022   LDLCALC 90 11/01/2022   TRIG 93 11/01/2022   CHOLHDL 4.2 05/24/2022   Lab Results  Component Value Date   NA 141 11/01/2022   K 5.1 11/01/2022   CREATININE 1.06 11/01/2022   EGFR 78 11/01/2022   GLUCOSE 97 11/01/2022   TSH 0.705 11/01/2022     The 10-year ASCVD risk score (Arnett DK, et al., 2019) is: 14.7%  ---------------------------------------------------------------------------------------------------     12/16/2022    8:07 AM 04/08/2022   10:31 AM 05/20/2021    9:55 AM  PHQ9  SCORE ONLY  PHQ-9 Total Score 0 1 2      12/16/2022    8:07 AM 05/24/2022    8:53 AM 05/20/2021    9:56 AM  GAD 7 : Generalized Anxiety Score  Nervous, Anxious, on Edge 0 1 1  Control/stop worrying 0 0 1  Worry too much - different things 0 0 1  Trouble relaxing 0 0 1  Restless 0 0 0  Easily annoyed or irritable 0 1 0  Afraid - awful might happen 0 0 1  Total GAD 7 Score 0 2 5  Anxiety Difficulty Not difficult at all Somewhat difficult Not difficult at all     Medications: Outpatient Medications Prior to Visit  Medication Sig   escitalopram (LEXAPRO) 20 MG tablet Take 1 tablet (20 mg total) by mouth daily.   losartan (COZAAR) 50 MG tablet Take 1 tablet (50 mg total) by mouth daily.   rosuvastatin (CRESTOR) 20 MG tablet Take 1 tablet (20 mg total) by mouth daily.   No facility-administered medications prior to visit.     Objective    BP 134/83 (BP Location: Right Arm, Patient Position: Sitting, Cuff Size: Normal)   Pulse 60   Ht 6' (1.829 m)   Wt 226 lb (102.5 kg)   SpO2 97%   BMI 30.65 kg/m   Physical Exam Vitals and nursing note reviewed.  Constitutional:      Appearance:  Normal appearance. He is obese.  HENT:     Head: Normocephalic and atraumatic.  Cardiovascular:     Rate and Rhythm: Normal rate and regular rhythm.     Pulses: Normal pulses.     Heart sounds: Normal heart sounds.  Pulmonary:     Effort: Pulmonary effort is normal.     Breath sounds: Normal breath sounds.  Musculoskeletal:        General: Normal range of motion.     Cervical back: Normal range of motion.  Skin:    General: Skin is warm and dry.     Capillary Refill: Capillary refill takes less than 2 seconds.  Neurological:     General: No focal deficit present.     Mental Status: He is alert and oriented to person, place, and time. Mental status is at baseline.  Psychiatric:        Mood and Affect: Mood normal.        Behavior: Behavior normal.        Thought Content: Thought content  normal.        Judgment: Judgment normal.     No results found for any visits on 12/16/22.  Assessment & Plan     Problem List Items Addressed This Visit       Cardiovascular and Mediastinum   Primary hypertension    Chronic, remains borderline Continue losartan 50 Continue diet and exercise program with low salt diet Defer changes at this time        Other   Erectile dysfunction    Check hormones today given complaints of shorter acting erection Continue to monitor side effects from lexapro adjustment  Advised blood pressure, HLD and diabetes can all affect small vessels and provide similar s/s       Relevant Orders   Testosterone,Free and Total   Mood disorder (HCC) - Primary    Chronic, improved Continue lexapro at 20 mg at this time       Prediabetes    Chronic, stable Continue to recommend balanced, lower carb meals. Smaller meal size, adding snacks. Choosing water as drink of choice and increasing purposeful exercise. Defer A1c to 3 month f/u       Return in about 6 weeks (around 01/27/2023) for annual examination.     Leilani Merl, FNP, have reviewed all documentation for this visit. The documentation on 12/16/22 for the exam, diagnosis, procedures, and orders are all accurate and complete.  Jacky Kindle, FNP  [facility] [phone] (phone) [phone] (fax)  [facility]

## 2022-12-16 NOTE — Assessment & Plan Note (Signed)
Chronic, improved Continue lexapro at 20 mg at this time

## 2022-12-18 NOTE — Progress Notes (Signed)
OK to mail.  Normal testosterone.

## 2022-12-19 ENCOUNTER — Other Ambulatory Visit: Payer: Self-pay | Admitting: Family Medicine

## 2022-12-19 ENCOUNTER — Encounter: Payer: Self-pay | Admitting: Family Medicine

## 2022-12-19 DIAGNOSIS — N529 Male erectile dysfunction, unspecified: Secondary | ICD-10-CM

## 2022-12-19 LAB — TESTOSTERONE,FREE AND TOTAL
Testosterone, Free: 6.3 pg/mL — ABNORMAL LOW (ref 6.6–18.1)
Testosterone: 404 ng/dL (ref 264–916)

## 2022-12-21 DIAGNOSIS — G540 Brachial plexus disorders: Secondary | ICD-10-CM | POA: Diagnosis not present

## 2022-12-21 DIAGNOSIS — M542 Cervicalgia: Secondary | ICD-10-CM | POA: Diagnosis not present

## 2022-12-21 DIAGNOSIS — M9901 Segmental and somatic dysfunction of cervical region: Secondary | ICD-10-CM | POA: Diagnosis not present

## 2022-12-21 DIAGNOSIS — M436 Torticollis: Secondary | ICD-10-CM | POA: Diagnosis not present

## 2022-12-26 DIAGNOSIS — M436 Torticollis: Secondary | ICD-10-CM | POA: Diagnosis not present

## 2022-12-26 DIAGNOSIS — M9901 Segmental and somatic dysfunction of cervical region: Secondary | ICD-10-CM | POA: Diagnosis not present

## 2022-12-26 DIAGNOSIS — G540 Brachial plexus disorders: Secondary | ICD-10-CM | POA: Diagnosis not present

## 2022-12-26 DIAGNOSIS — M542 Cervicalgia: Secondary | ICD-10-CM | POA: Diagnosis not present

## 2022-12-28 DIAGNOSIS — M542 Cervicalgia: Secondary | ICD-10-CM | POA: Diagnosis not present

## 2022-12-28 DIAGNOSIS — M436 Torticollis: Secondary | ICD-10-CM | POA: Diagnosis not present

## 2022-12-28 DIAGNOSIS — M9901 Segmental and somatic dysfunction of cervical region: Secondary | ICD-10-CM | POA: Diagnosis not present

## 2022-12-28 DIAGNOSIS — G540 Brachial plexus disorders: Secondary | ICD-10-CM | POA: Diagnosis not present

## 2022-12-30 DIAGNOSIS — M436 Torticollis: Secondary | ICD-10-CM | POA: Diagnosis not present

## 2022-12-30 DIAGNOSIS — M542 Cervicalgia: Secondary | ICD-10-CM | POA: Diagnosis not present

## 2022-12-30 DIAGNOSIS — G540 Brachial plexus disorders: Secondary | ICD-10-CM | POA: Diagnosis not present

## 2022-12-30 DIAGNOSIS — M9901 Segmental and somatic dysfunction of cervical region: Secondary | ICD-10-CM | POA: Diagnosis not present

## 2023-01-09 DIAGNOSIS — M436 Torticollis: Secondary | ICD-10-CM | POA: Diagnosis not present

## 2023-01-09 DIAGNOSIS — G540 Brachial plexus disorders: Secondary | ICD-10-CM | POA: Diagnosis not present

## 2023-01-09 DIAGNOSIS — M9901 Segmental and somatic dysfunction of cervical region: Secondary | ICD-10-CM | POA: Diagnosis not present

## 2023-01-09 DIAGNOSIS — M542 Cervicalgia: Secondary | ICD-10-CM | POA: Diagnosis not present

## 2023-01-13 ENCOUNTER — Encounter: Payer: Self-pay | Admitting: Urology

## 2023-01-13 ENCOUNTER — Ambulatory Visit: Payer: PPO | Admitting: Urology

## 2023-01-13 VITALS — BP 154/84 | HR 67 | Ht 72.0 in | Wt 225.0 lb

## 2023-01-13 DIAGNOSIS — N5201 Erectile dysfunction due to arterial insufficiency: Secondary | ICD-10-CM

## 2023-01-13 MED ORDER — TADALAFIL 20 MG PO TABS: ORAL_TABLET | ORAL | 0 refills | Status: DC

## 2023-01-13 NOTE — Progress Notes (Signed)
Brandon Montes,acting as a scribe for Brandon Altes, MD.,have documented all relevant documentation on the behalf of Brandon Altes, MD,as directed by  Brandon Altes, MD while in the presence of Brandon Altes, MD.  01/13/23 12:35 PM   Sudie Grumbling 1955/11/11 [id number]  Referring provider: Jacky Kindle, FNP [address] [place],  [place] [address]  Chief Complaint  Patient presents with   Erectile Dysfunction    HPI:  Brandon Montes is a 67 y.o. male who is referred for evaluation of erectile dysfunction.   After the first of the year, noted difficulty maintaining an erection. No problems achieving an erection He is on Lexapro and Losartan. Both were started after the first of the year and coincide with the onset of his symptoms Prior history of tobacco use. He smoked 1 pack per day for approximately 20 years and quit in 1997. No bothersome LUTS. No previous history of urologic problems Testosterone level was checked, which was normal at 407.   PMH: Past Medical History:  Diagnosis Date   Fatty liver    Gall stones     Surgical History: Past Surgical History:  Procedure Laterality Date   COLONOSCOPY WITH PROPOFOL N/A 05/04/2015   Procedure: COLONOSCOPY WITH PROPOFOL;  Surgeon: Christena Deem, MD;  Location: [facility] ENDOSCOPY;  Service: Endoscopy;  Laterality: N/A;   CYST EXCISION     from right wrist   CYSTECTOMY     TONSILLECTOMY     VASECTOMY      Home Medications:  Allergies as of 01/13/2023       Reactions   Shellfish-derived Products Nausea And Vomiting        Medication List        Accurate as of January 13, 2023 12:35 PM. If you have any questions, ask your nurse or doctor.          escitalopram 20 MG tablet Commonly known as: Lexapro Take 1 tablet (20 mg total) by mouth daily.   losartan 50 MG tablet Commonly known as: COZAAR Take 1 tablet (50 mg total) by mouth daily.   rosuvastatin 20 MG tablet Commonly known as:  Crestor Take 1 tablet (20 mg total) by mouth daily.   tadalafil 20 MG tablet Commonly known as: CIALIS Take 1 tab 1 hour prior to intercourse Started by: Brandon Montes        Allergies:  Allergies  Allergen Reactions   Shellfish-Derived Products Nausea And Vomiting    Family History: Family History  Problem Relation Age of Onset   Diabetes Father    Renal cancer Father    Diabetes Paternal Grandfather     Social History:  reports that he has quit smoking. He has quit using smokeless tobacco. He reports that he does not drink alcohol and does not use drugs.   Physical Exam: BP (!) 154/84   Pulse 67   Ht 6' (1.829 m)   Wt 225 lb (102.1 kg)   BMI 30.52 kg/m   Constitutional:  Alert and oriented, No acute distress. HEENT: [place] AT, moist mucus membranes.  Trachea midline, no masses. GU: Phallus without lesions. Testes descended bilaterally without masses or tenderness.  Neurologic: Grossly intact, no focal deficits, moving all 4 extremities. Psychiatric: Normal mood and affect.   Assessment & Plan:    1. Erectile dysfunction We did discuss both Lexapro and Losartan can be associated with erectile dysfunction and starting these medications coincide with the onset of symptoms  Total testosterone level was normal. A free testosterone level was also checked, which was low. However, American Urological Association practice guidelines for treatment of hypogonadism do not recommend basing treatment on total testosterone levels only.  He was interested in a PDE-5 inhibitor and Rx tadalafil 20 mg sent to pharmacy He will return if the PDE-5 inhibitor is not effective   [facility] [address], [address] [place], [place] [address] [phone]

## 2023-01-16 DIAGNOSIS — M542 Cervicalgia: Secondary | ICD-10-CM | POA: Diagnosis not present

## 2023-01-16 DIAGNOSIS — M9901 Segmental and somatic dysfunction of cervical region: Secondary | ICD-10-CM | POA: Diagnosis not present

## 2023-01-16 DIAGNOSIS — G540 Brachial plexus disorders: Secondary | ICD-10-CM | POA: Diagnosis not present

## 2023-01-16 DIAGNOSIS — M436 Torticollis: Secondary | ICD-10-CM | POA: Diagnosis not present

## 2023-01-23 DIAGNOSIS — G540 Brachial plexus disorders: Secondary | ICD-10-CM | POA: Diagnosis not present

## 2023-01-23 DIAGNOSIS — M9901 Segmental and somatic dysfunction of cervical region: Secondary | ICD-10-CM | POA: Diagnosis not present

## 2023-01-23 DIAGNOSIS — M542 Cervicalgia: Secondary | ICD-10-CM | POA: Diagnosis not present

## 2023-01-23 DIAGNOSIS — M436 Torticollis: Secondary | ICD-10-CM | POA: Diagnosis not present

## 2023-01-30 ENCOUNTER — Encounter: Payer: PPO | Admitting: Family Medicine

## 2023-01-31 ENCOUNTER — Ambulatory Visit: Payer: PPO | Admitting: Family Medicine

## 2023-01-31 VITALS — BP 131/80 | HR 74 | Resp 16 | Ht 72.0 in | Wt 230.2 lb

## 2023-01-31 DIAGNOSIS — Z0000 Encounter for general adult medical examination without abnormal findings: Secondary | ICD-10-CM | POA: Insufficient documentation

## 2023-01-31 DIAGNOSIS — R209 Unspecified disturbances of skin sensation: Secondary | ICD-10-CM

## 2023-01-31 DIAGNOSIS — Z0001 Encounter for general adult medical examination with abnormal findings: Secondary | ICD-10-CM

## 2023-01-31 DIAGNOSIS — I10 Essential (primary) hypertension: Secondary | ICD-10-CM

## 2023-01-31 DIAGNOSIS — F39 Unspecified mood [affective] disorder: Secondary | ICD-10-CM | POA: Diagnosis not present

## 2023-01-31 DIAGNOSIS — K5000 Crohn's disease of small intestine without complications: Secondary | ICD-10-CM

## 2023-01-31 MED ORDER — ESCITALOPRAM OXALATE 20 MG PO TABS: 20.0000 mg | ORAL_TABLET | Freq: Every day | ORAL | 0 refills | Status: DC

## 2023-01-31 NOTE — Progress Notes (Signed)
 Annual Wellness Visit     Patient: Brandon Montes, Male    DOB: January 24, 1956, 68 y.o.   MRN: [id number] Visit Date: 01/31/2023  Today's Provider: LAURAINE LOISE BUOY, DO   Chief Complaint  Patient presents with   Welcome to Medicare   Subjective    Brandon Montes is a 68 y.o. male who presents today for his Annual Wellness Visit. He reports consuming a  general  diet but has been cutting out added sugars, dairy and bread.  He plays sports through the year.  He generally feels well. He reports sleeping well. He does have additional problems to discuss today.   HPI The patient, with a history of hypertension and prediabetes, presents for his Welcome to [facility] Visit. He reports no history of heart problems and denies having Crohn's disease, despite previous treatment for the condition, as he was informed there was no evidence of Crohn's on his previous colonoscopy and the GI doctor stopped the medication for it. The patient is up-to-date with vaccinations, including the COVID-19 vaccine. He reports no advanced care planning or power of attorney in place.  The patient describes his overall health as good, despite some ongoing issues. He has been dealing with an Achille's tendon issue for the past three years, which he believes is improving. He also reports a problem with his hamstring, which does not cause pain but feels like a knot, particularly after playing multiple games of softball in a day or during long drives. The patient has recently joined a senior gym to work on leg strength and weight loss.  The patient is currently taking losartan  for blood pressure control and Lexapro , both of which he reports are working well. He denies any regular use of pain medications and reports no significant changes in vision, although he acknowledges his vision is not the best. He was told he has no significant abnormalities by his eye doctor a few months ago. He reports sleeping well and has a goal to lose  weight, aiming to reach between 210 and 215 pounds within the next six months.  The patient reports daily swelling in his legs, particularly at the end of the day, and cold feet. He denies any numbness or tingling in the legs. He also reports a feeling of a pad under one foot, following the removal of a nodule from the arch of that foot. The patient denies any pain in the legs when walking but reports that his feet feel cold. He has not noticed any changes in color in his feet.  The patient is a former smoker, having quit in 1997. He reports no difficulty with daily activities at home and follows a general diet, although he admits he does not restrict his diet as much as he should. He is currently trying to cut out all sugars, dairy, and bread from his diet to aid in weight loss. He denies any recent dizziness, lightheadedness, headaches, fever, chills, or fatigue.   Medications: Outpatient Medications Prior to Visit  Medication Sig   losartan  (COZAAR ) 50 MG tablet Take 1 tablet (50 mg total) by mouth daily.   rosuvastatin  (CRESTOR ) 20 MG tablet Take 1 tablet (20 mg total) by mouth daily.   tadalafil  (CIALIS ) 20 MG tablet Take 1 tab 1 hour prior to intercourse   [DISCONTINUED] escitalopram  (LEXAPRO ) 20 MG tablet Take 1 tablet (20 mg total) by mouth daily.   No facility-administered medications prior to visit.    Allergies  Allergen Reactions  Shellfish-Derived Products Nausea And Vomiting    Patient Care Team: Donzella Lauraine SAILOR, DO as PCP - General (Family Medicine)  Review of Systems  Constitutional:  Negative for appetite change, chills, fatigue and fever.  HENT:  Negative for congestion, ear pain, hearing loss, nosebleeds and trouble swallowing.   Eyes:  Negative for pain and visual disturbance.  Respiratory:  Negative for cough, chest tightness and shortness of breath.   Cardiovascular:  Negative for chest pain, palpitations and leg swelling.       +cold sensation in feet  persistently  Gastrointestinal:  Negative for abdominal pain, blood in stool, constipation, diarrhea, nausea and vomiting.  Endocrine: Negative for polydipsia, polyphagia and polyuria.  Genitourinary:  Negative for dysuria and flank pain.  Musculoskeletal:  Negative for arthralgias, back pain, joint swelling, myalgias and neck stiffness.  Skin:  Negative for color change, rash and wound.  Neurological:  Negative for dizziness, tremors, seizures, speech difficulty, weakness, light-headedness and headaches.  Psychiatric/Behavioral:  Negative for behavioral problems, confusion, decreased concentration, dysphoric mood and sleep disturbance. The patient is not nervous/anxious.   All other systems reviewed and are negative.        Objective    Vitals: BP 131/80 (BP Location: Right Arm, Patient Position: Sitting, Cuff Size: Large)   Pulse 74   Resp 16   Ht 6' (1.829 m)   Wt 230 lb 3.2 oz (104.4 kg)   BMI 31.22 kg/m    ECG: NSR without ectopy, without T-wave abnormalities and with normal axes. Rate 70, PR interval 198, QRS 106, QT/QTC 402/434.   Physical Exam Vitals and nursing note reviewed.  Constitutional:      General: He is awake.     Appearance: Normal appearance.  HENT:     Head: Normocephalic and atraumatic.     Right Ear: Tympanic membrane, ear canal and external ear normal.     Left Ear: Tympanic membrane, ear canal and external ear normal.     Nose: Nose normal.     Mouth/Throat:     Mouth: Mucous membranes are moist.     Pharynx: Oropharynx is clear. No oropharyngeal exudate or posterior oropharyngeal erythema.  Eyes:     General: No scleral icterus.    Extraocular Movements: Extraocular movements intact.     Conjunctiva/sclera: Conjunctivae normal.     Pupils: Pupils are equal, round, and reactive to light.  Neck:     Thyroid : No thyromegaly or thyroid  tenderness.  Cardiovascular:     Rate and Rhythm: Normal rate and regular rhythm.     Pulses: Normal pulses.      Heart sounds: Normal heart sounds.  Pulmonary:     Effort: Pulmonary effort is normal. No tachypnea, bradypnea or respiratory distress.     Breath sounds: Normal breath sounds. No stridor. No wheezing, rhonchi or rales.  Abdominal:     General: Bowel sounds are normal. There is no distension.     Palpations: Abdomen is soft. There is no mass.     Tenderness: There is no abdominal tenderness. There is no guarding.     Hernia: No hernia is present.  Musculoskeletal:     Cervical back: Normal range of motion and neck supple.     Right lower leg: Edema (trace) present.     Left lower leg: Edema (trace) present.  Lymphadenopathy:     Cervical: No cervical adenopathy.  Skin:    General: Skin is warm and dry.  Neurological:     Mental Status: He  is alert and oriented to person, place, and time. Mental status is at baseline.  Psychiatric:        Mood and Affect: Mood normal.        Behavior: Behavior normal.     Most recent functional status assessment:    01/31/2023    3:07 PM  In your present state of health, do you have any difficulty performing the following activities:  Hearing? 0  Vision? 0  Difficulty concentrating or making decisions? 0  Walking or climbing stairs? 0  Dressing or bathing? 0  Doing errands, shopping? 0   Most recent fall risk assessment:    01/28/2023    1:50 PM  Fall Risk   Falls in the past year? 0  Number falls in past yr: 0  Injury with Fall? 0  Risk for fall due to : No Fall Risks    Most recent depression screenings:    01/31/2023    2:59 PM 12/16/2022    8:07 AM  PHQ 2/9 Scores  PHQ - 2 Score 0 0  PHQ- 9 Score 0 0   Most recent cognitive screening:    01/31/2023    2:59 PM  6CIT Screen  What Year? 0 points  What month? 0 points  What time? 0 points  Count back from 20 0 points  Months in reverse 0 points  Repeat phrase 6 points  Total Score 6 points   Most recent Audit-C alcohol use screening    01/31/2023    9:37 AM  Alcohol  Use Disorder Test (AUDIT)  1. How often do you have a drink containing alcohol? 0  3. How often do you have six or more drinks on one occasion? 0   A score of 3 or more in women, and 4 or more in men indicates increased risk for alcohol abuse, EXCEPT if all of the points are from question 1   No results found for any visits on 01/31/23.  Assessment & Plan     Annual wellness visit done today including the all of the following: Reviewed patient's Family Medical History Reviewed and updated list of patient's medical providers Assessment of cognitive impairment was done Assessed patient's functional ability Established a written schedule for health screening services Health Risk Assessent Completed and Reviewed Patient is not currently on any opioid medications.  Exercise Activities and Dietary recommendations  Goals      Weight (lb) < 215 lb (97.5 kg)     Goal weight between 210 and 215 lbs. Try to achieve goal within upcoming six months.        Immunization History  Administered Date(s) Administered   Influenza,inj,Quad PF,6+ Mos 04/08/2016   Influenza-Unspecified 10/17/2022   Moderna Covid-19 Vaccine Bivalent Booster 64yrs & up 11/29/2020   Moderna Sars-Covid-2 Vaccination 05/03/2019, 05/31/2019, 01/12/2020   PNEUMOCOCCAL CONJUGATE-20 11/01/2022   Pfizer(Comirnaty)Fall Seasonal Vaccine 12 years and older 10/05/2022   Td 04/02/2015   Tdap 08/11/2005   Zoster Recombinant(Shingrix ) 05/24/2022, 11/01/2022    Health Maintenance  Topic Date Due   Medicare Annual Wellness (AWV)  01/31/2024   DTaP/Tdap/Td (3 - Td or Tdap) 04/01/2025   Colonoscopy  05/27/2027   Pneumonia Vaccine 23+ Years old  Completed   INFLUENZA VACCINE  Completed   COVID-19 Vaccine  Completed   Hepatitis C Screening  Completed   HIV Screening  Completed   Zoster Vaccines- Shingrix   Completed   HPV VACCINES  Aged Out     Discussed health benefits of physical  activity, and encouraged him to engage in  regular exercise appropriate for his age and condition.    Medicare welcome exam Assessment & Plan: Physical exam overall unremarkable except as noted above. Blood work up-to-date at this time. ECG unremarkable NSR as noted above. Up to date on vaccines. No current advanced care planning in place. Discussed importance of advanced care planning and offered to provide information. - Discuss advanced care planning and provide information if requested.   Orders: -     EKG 12-Lead  Crohn's disease of small intestine without complications (HCC) Assessment & Plan: Determined to not be Crohn's during colonoscopy   Mood disorder [facility]) Assessment & Plan: Well-controlled a this time. Continue escitalopram  20 mg daily  Orders: -     Escitalopram  Oxalate; Take 1 tablet (20 mg total) by mouth daily.  Dispense: 90 tablet; Refill: 0  Primary hypertension Assessment & Plan: Blood pressure well-controlled with losartan . Monitoring blood pressure daily. Discussed that weight loss and exercise may help reduce blood pressure but not guaranteed to eliminate the need for medication. - Continue losartan  50 mg daily - Monitor blood pressure regularly - Follow up in six months for hypertension management   Bilateral cold feet Assessment & Plan: Patient states he is not concerned about this and only mentioned it because his wife was concerned. Discussed the possibility of ultrasound/ABI for further evaluation which patient declines at this time.    Hamstring Tightness/Pain Chronic hamstring tightness and pain since mid-2024, particularly after prolonged physical activity and while driving. Differential includes muscle strain or sciatica. Prefers management with stretching and exercise before further interventions. 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 No current physical therapy. - Continue current exercise regimen - Consider follow up with sports medicine or podiatry if it doesn't continue to improve.    Return in about 1 year (around 01/31/2024) for AWV with AW team; in 6 months for HTN/preDM f/u.     I discussed the assessment and treatment plan with the patient  The patient was provided an opportunity to ask questions and all were answered. The patient agreed with the plan and demonstrated an understanding of the instructions.   The patient was advised to call back or seek an in-person evaluation if the symptoms worsen or if the condition fails to improve as anticipated.    LAURAINE LOISE BUOY, DO  [facility] [phone] (phone) [phone] (fax)  [facility]

## 2023-01-31 NOTE — Patient Instructions (Addendum)
 Mr. Brandon Montes , Thank you for taking time to come for your Medicare Wellness Visit. I appreciate your ongoing commitment to your health goals. Please review the following plan we discussed and let me know if I can assist you in the future.   These are the goals we discussed:  Goals      Weight (lb) < 215 lb (97.5 kg)     Goal weight between 210 and 215 lbs. Try to achieve goal within upcoming six months.        This is a list of the screening recommended for you and due dates:  Health Maintenance  Topic Date Due   COVID-19 Vaccine (5 - 2024-25 season) 09/25/2022   Medicare Annual Wellness Visit  01/31/2024   DTaP/Tdap/Td vaccine (3 - Td or Tdap) 04/01/2025   Colon Cancer Screening  05/27/2027   Pneumonia Vaccine  Completed   Flu Shot  Completed   Hepatitis C Screening  Completed   HIV Screening  Completed   Zoster (Shingles) Vaccine  Completed   HPV Vaccine  Aged Out

## 2023-01-31 NOTE — Assessment & Plan Note (Addendum)
 Physical exam overall unremarkable except as noted above. Blood work up-to-date at this time. ECG unremarkable NSR as noted above. Up to date on vaccines. No current advanced care planning in place. Discussed importance of advanced care planning and offered to provide information. - Discuss advanced care planning and provide information if requested.

## 2023-01-31 NOTE — Assessment & Plan Note (Signed)
 Patient states he is not concerned about this and only mentioned it because his wife was concerned. Discussed the possibility of ultrasound/ABI for further evaluation which patient declines at this time.

## 2023-01-31 NOTE — Assessment & Plan Note (Signed)
 Well-controlled a this time. Continue escitalopram 20 mg daily

## 2023-01-31 NOTE — Assessment & Plan Note (Signed)
 Determined to not be Crohn's during colonoscopy

## 2023-01-31 NOTE — Assessment & Plan Note (Signed)
 Blood pressure well-controlled with losartan . Monitoring blood pressure daily. Discussed that weight loss and exercise may help reduce blood pressure but not guaranteed to eliminate the need for medication. - Continue losartan  50 mg daily - Monitor blood pressure regularly - Follow up in six months for hypertension management

## 2023-02-01 DIAGNOSIS — G540 Brachial plexus disorders: Secondary | ICD-10-CM | POA: Diagnosis not present

## 2023-02-01 DIAGNOSIS — M436 Torticollis: Secondary | ICD-10-CM | POA: Diagnosis not present

## 2023-02-01 DIAGNOSIS — M542 Cervicalgia: Secondary | ICD-10-CM | POA: Diagnosis not present

## 2023-02-01 DIAGNOSIS — M9901 Segmental and somatic dysfunction of cervical region: Secondary | ICD-10-CM | POA: Diagnosis not present

## 2023-04-25 ENCOUNTER — Other Ambulatory Visit: Payer: Self-pay | Admitting: Family Medicine

## 2023-04-25 DIAGNOSIS — I10 Essential (primary) hypertension: Secondary | ICD-10-CM

## 2023-04-25 MED ORDER — LOSARTAN POTASSIUM 50 MG PO TABS
50.0000 mg | ORAL_TABLET | Freq: Every day | ORAL | 1 refills | Status: DC
Start: 2023-04-25 — End: 2023-10-18

## 2023-04-25 NOTE — Telephone Encounter (Signed)
[facility] faxed refill request for the following medications:  losartan (COZAAR) 50 MG tablet   Please advise.

## 2023-07-20 ENCOUNTER — Other Ambulatory Visit: Payer: Self-pay | Admitting: Family Medicine

## 2023-07-20 DIAGNOSIS — F39 Unspecified mood [affective] disorder: Secondary | ICD-10-CM

## 2023-07-31 ENCOUNTER — Ambulatory Visit: Payer: Self-pay | Admitting: Family Medicine

## 2023-08-01 ENCOUNTER — Ambulatory Visit (INDEPENDENT_AMBULATORY_CARE_PROVIDER_SITE_OTHER): Admitting: Family Medicine

## 2023-08-01 ENCOUNTER — Encounter: Payer: Self-pay | Admitting: Family Medicine

## 2023-08-01 VITALS — BP 128/71 | HR 58 | Ht 72.0 in | Wt 225.6 lb

## 2023-08-01 DIAGNOSIS — R413 Other amnesia: Secondary | ICD-10-CM

## 2023-08-01 DIAGNOSIS — I10 Essential (primary) hypertension: Secondary | ICD-10-CM | POA: Diagnosis not present

## 2023-08-01 DIAGNOSIS — N182 Chronic kidney disease, stage 2 (mild): Secondary | ICD-10-CM

## 2023-08-01 DIAGNOSIS — R7303 Prediabetes: Secondary | ICD-10-CM

## 2023-08-01 DIAGNOSIS — G8929 Other chronic pain: Secondary | ICD-10-CM

## 2023-08-01 DIAGNOSIS — F39 Unspecified mood [affective] disorder: Secondary | ICD-10-CM

## 2023-08-01 DIAGNOSIS — M25551 Pain in right hip: Secondary | ICD-10-CM

## 2023-08-01 DIAGNOSIS — E782 Mixed hyperlipidemia: Secondary | ICD-10-CM

## 2023-08-01 DIAGNOSIS — R209 Unspecified disturbances of skin sensation: Secondary | ICD-10-CM

## 2023-08-01 NOTE — Progress Notes (Signed)
 Established patient visit   Patient: Brandon Montes   DOB: 19-Oct-1955   68 y.o. Male  MRN: [id number] Visit Date: 08/01/2023  Today's healthcare provider: LAURAINE LOISE BUOY, DO   Chief Complaint  Patient presents with   Diabetes   Hypertension   Subjective    Diabetes Pertinent negatives for hypoglycemia include no dizziness. Pertinent negatives for diabetes include no chest pain.  Hypertension Pertinent negatives include no chest pain, palpitations or shortness of breath.   Lavarr President is a 68 year old male with hypertension and prediabetes who presents for follow-up on blood pressure and A1c recheck.  He is concerned about his weight, noting he is not close to his target of 215 pounds. He eats poorly and does not actively avoid carbohydrates, although he tries to watch what he eats. He is aware of his prediabetic status and is not currently checking blood sugars at home.  His blood pressure is usually good when checked at home, with systolics typically in the mid-130s. He is currently taking losartan  50 mg and rosuvastatin  20 mg, both of which were refilled within the past two weeks. He also takes escitalopram  and sporadic tadalafil .  He has a history of Achilles tendon issues, which have improved significantly over the past four years, allowing him to play softball without heel pain. However, he now experiences right hip pain, particularly when driving. He received a cortisone shot, which provided some relief, but the pain persists. He is considering physical therapy if the pain does not improve and plans to follow-up with orthopedics for this.  He experiences cold feet, which do not bother him, though they bother his wife, and occasional ankle swelling, particularly after wearing crew socks or being on his feet. He has not noticed any varicose veins.  He reports no history of problems with his thyroid . He has some concerns about his memory, noting difficulty recalling  numbers, which contrasts with his past ability to visualize blueprints. 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  He received the 2024-2025 COVID booster this past Fall. He inquires about supplements that may help memory and overall health. He is interested in maintaining his health through activity and diet.      Medications: Outpatient Medications Prior to Visit  Medication Sig   escitalopram  (LEXAPRO ) 20 MG tablet TAKE 1 TABLET BY MOUTH EVERY DAY   losartan  (COZAAR ) 50 MG tablet Take 1 tablet (50 mg total) by mouth daily.   rosuvastatin  (CRESTOR ) 20 MG tablet Take 1 tablet (20 mg total) by mouth daily.   tadalafil  (CIALIS ) 20 MG tablet Take 1 tab 1 hour prior to intercourse   No facility-administered medications prior to visit.    Review of Systems  Respiratory:  Negative for chest tightness, shortness of breath and wheezing.   Cardiovascular:  Negative for chest pain and palpitations.  Gastrointestinal:  Negative for abdominal pain, nausea and vomiting.  Musculoskeletal:  Positive for arthralgias (right hip pain).  Neurological:  Negative for dizziness, light-headedness and numbness.        Objective    BP 128/71 (BP Location: Left Arm, Patient Position: Sitting, Cuff Size: Normal)   Pulse (!) 58   Ht 6' (1.829 m)   Wt 225 lb 9.6 oz (102.3 kg)   SpO2 98%   BMI 30.60 kg/m     Physical Exam Vitals reviewed.  Constitutional:      General: He is not in acute distress.    Appearance:  Normal appearance. He is not diaphoretic.  HENT:     Head: Normocephalic and atraumatic.  Eyes:     General: No scleral icterus.    Conjunctiva/sclera: Conjunctivae normal.  Cardiovascular:     Rate and Rhythm: Normal rate and regular rhythm.     Pulses: Normal pulses.     Heart sounds: Normal heart sounds. No murmur heard. Pulmonary:     Effort: Pulmonary effort is normal. No respiratory distress.     Breath sounds: Normal breath sounds. No wheezing or rhonchi.   Musculoskeletal:     Cervical back: Neck supple.     Right lower leg: No edema.     Left lower leg: No edema.  Lymphadenopathy:     Cervical: No cervical adenopathy.  Skin:    General: Skin is warm and dry.     Findings: No rash.  Neurological:     Mental Status: He is alert and oriented to person, place, and time. Mental status is at baseline.  Psychiatric:        Mood and Affect: Mood normal.        Behavior: Behavior normal.      No results found for any visits on 08/01/23.  Assessment & Plan    Primary hypertension  Chronic kidney disease, stage 2, mildly decreased GFR -     Comprehensive metabolic panel with GFR -     Microalbumin / creatinine urine ratio  Prediabetes -     Hemoglobin A1c  Memory changes -     Vitamin B12  Mood disorder (HCC)  Mixed hyperlipidemia -     Lipid panel  Bilateral cold feet  Chronic right hip pain     Hypertension Blood pressure well-controlled with losartan  50 mg. Occasional home monitoring shows good results. - Continue losartan  50 mg. - Encourage regular home blood pressure monitoring.  Chronic Kidney Disease Stage 2 GFR between 60 and 90. Discussed monitoring kidney function and controlling blood pressure, cholesterol, and blood sugar to help prevent progression. Explained as entry-level CKD. - Order urine sample to assess kidney function. - Encourage lifestyle modifications to maintain/optimize blood pressure, cholesterol, and blood sugar control.  Prediabetes Prediabetic with poor dietary habits, particularly high carbohydrate intake. Discussed monitoring blood sugar and role of A1c. - Order hemoglobin A1c test. - Encourage dietary modifications to reduce carbohydrate intake.  Memory Changes General memory concerns without significant cognitive impairment. Discussed mental exercises and potential B12 deficiency impact. - Encourage mental exercises such as crossword puzzles and critical thinking games. - Order B12  level to assess for deficiency.  Mood disorder Chronic, stable.  Continue escitalopram  20 mg daily.  Mixed hyperlipidemia Recheck cholesterol panel today.  Continue rosuvastatin  20 mg daily.  Bilateral Cold Feet  Cold feet with occasional ankle swelling after wearing crew socks and especially with prolonged standing. No varicose veins or swelling noted on exam. Not interested in further diagnostics unless symptoms worsen. - Monitor symptoms and consider ultrasound with ankle-brachial index if symptoms worsen.  Right hip pain Chronic hip pain with minimal relief from cortisone injection. No neurological symptoms. Plans to wait a few more weeks before proceeding to further intervention. - Patient will be following up with orthopedics for physical therapy if symptoms persist.  General Health Maintenance Received 2024 COVID booster. Discussed recommendation by CDC for a second dose of the 2020 04-2023 COVID-vaccine. - Recommend receiving a second dose of the 2024 COVID booster.    Return in about 6 months (around 02/01/2024) for CPE, and  for mAWV with AWV nurse.      I discussed the assessment and treatment plan with the patient  The patient was provided an opportunity to ask questions and all were answered. The patient agreed with the plan and demonstrated an understanding of the instructions.   The patient was advised to call back or seek an in-person evaluation if the symptoms worsen or if the condition fails to improve as anticipated.    LAURAINE LOISE BUOY, DO  [facility] [phone] (phone) [phone] (fax)  [facility]

## 2023-08-03 LAB — COMPREHENSIVE METABOLIC PANEL WITH GFR
ALT: 21 IU/L (ref 0–44)
AST: 21 IU/L (ref 0–40)
Albumin: 4.3 g/dL (ref 3.9–4.9)
Alkaline Phosphatase: 61 IU/L (ref 44–121)
BUN/Creatinine Ratio: 12 (ref 10–24)
BUN: 16 mg/dL (ref 8–27)
Bilirubin Total: 0.4 mg/dL (ref 0.0–1.2)
CO2: 22 mmol/L (ref 20–29)
Calcium: 9.3 mg/dL (ref 8.6–10.2)
Chloride: 104 mmol/L (ref 96–106)
Creatinine, Ser: 1.29 mg/dL — ABNORMAL HIGH (ref 0.76–1.27)
Globulin, Total: 2.2 g/dL (ref 1.5–4.5)
Glucose: 82 mg/dL (ref 70–99)
Potassium: 4.7 mmol/L (ref 3.5–5.2)
Sodium: 141 mmol/L (ref 134–144)
Total Protein: 6.5 g/dL (ref 6.0–8.5)
eGFR: 61 mL/min/1.73 (ref >59)

## 2023-08-03 LAB — MICROALBUMIN / CREATININE URINE RATIO
Creatinine, Urine: 297.3 mg/dL
Microalb/Creat Ratio: 5 mg/g{creat} (ref 0–29)
Microalbumin, Urine: 16.3 ug/mL

## 2023-08-03 LAB — VITAMIN B12: Vitamin B-12: 404 pg/mL (ref 232–1245)

## 2023-08-03 LAB — LIPID PANEL
Chol/HDL Ratio: 2.7 ratio (ref 0.0–5.0)
Cholesterol, Total: 89 mg/dL — ABNORMAL LOW (ref 100–199)
HDL: 33 mg/dL — ABNORMAL LOW (ref >39)
LDL Chol Calc (NIH): 28 mg/dL (ref 0–99)
Triglycerides: 173 mg/dL — ABNORMAL HIGH (ref 0–149)
VLDL Cholesterol Cal: 28 mg/dL (ref 5–40)

## 2023-08-03 LAB — HEMOGLOBIN A1C
Est. average glucose Bld gHb Est-mCnc: 128 mg/dL
Hgb A1c MFr Bld: 6.1 % — ABNORMAL HIGH (ref 4.8–5.6)

## 2023-08-10 ENCOUNTER — Ambulatory Visit: Payer: Self-pay | Admitting: Family Medicine

## 2023-08-10 DIAGNOSIS — E782 Mixed hyperlipidemia: Secondary | ICD-10-CM

## 2023-08-10 MED ORDER — ATORVASTATIN CALCIUM 20 MG PO TABS: 20.0000 mg | ORAL_TABLET | Freq: Every day | ORAL | 3 refills | Status: AC

## 2023-08-21 ENCOUNTER — Encounter: Payer: Self-pay | Admitting: *Deleted

## 2023-08-21 ENCOUNTER — Other Ambulatory Visit: Payer: Self-pay

## 2023-08-21 ENCOUNTER — Emergency Department
Admission: EM | Admit: 2023-08-21 | Discharge: 2023-08-21 | Disposition: A | Discharge status: Home / Self Care | Attending: Emergency Medicine | Admitting: Emergency Medicine

## 2023-08-21 DIAGNOSIS — R519 Headache, unspecified: Secondary | ICD-10-CM | POA: Diagnosis not present

## 2023-08-21 DIAGNOSIS — K0889 Other specified disorders of teeth and supporting structures: Secondary | ICD-10-CM | POA: Insufficient documentation

## 2023-08-21 MED ORDER — AMOXICILLIN-POT CLAVULANATE 875-125 MG PO TABS: 1.0000 | ORAL_TABLET | Freq: Two times a day (BID) | ORAL | 0 refills | Status: AC

## 2023-08-21 MED ORDER — ACETAMINOPHEN 500 MG PO TABS
1000.0000 mg | ORAL_TABLET | Freq: Once | ORAL | Status: AC
  Administered 2023-08-21: 1000 mg via ORAL
  Filled 2023-08-21: qty 2

## 2023-08-21 MED ORDER — CHLORHEXIDINE GLUCONATE 0.12 % MT SOLN: 15.0000 mL | Freq: Two times a day (BID) | OROMUCOSAL | 0 refills | Status: AC

## 2023-08-21 MED ORDER — MAGIC MOUTHWASH W/LIDOCAINE: 5.0000 mL | Freq: Four times a day (QID) | ORAL | 0 refills | Status: DC | PRN

## 2023-08-21 MED ORDER — LIDOCAINE VISCOUS HCL 2 % MT SOLN
15.0000 mL | Freq: Once | OROMUCOSAL | Status: AC
  Administered 2023-08-21: 15 mL via OROMUCOSAL
  Filled 2023-08-21: qty 15

## 2023-08-21 MED ORDER — HYDROCODONE-ACETAMINOPHEN 5-325 MG PO TABS: 2.0000 | ORAL_TABLET | Freq: Four times a day (QID) | ORAL | 0 refills | Status: DC | PRN

## 2023-08-21 MED ORDER — KETOROLAC TROMETHAMINE 30 MG/ML IJ SOLN
30.0000 mg | Freq: Once | INTRAMUSCULAR | Status: AC
  Administered 2023-08-21: 30 mg via INTRAMUSCULAR
  Filled 2023-08-21: qty 1

## 2023-08-21 NOTE — ED Triage Notes (Signed)
 Pt has left upper tooth pain for 2 days.  Pt is taking motrin without pain relief.  Pt alert.

## 2023-08-21 NOTE — ED Provider Notes (Signed)
 [facility] Provider Note    Event Date/Time   First MD Initiated Contact with Patient 08/21/23 0149     (approximate)   History   Dental Pain   HPI Brandon Montes is a 68 y.o. male who presents for evaluation of left upper tooth and facial pain.  He said it came on him out of the blue yesterday.  He took some ibuprofen but it did not seem to help very much.  He has had dental pain in the past and this feels similar.  He had no specific dental trauma of which he is aware and does not remember cracking a tooth or otherwise injuring himself.  He does not feel like there is swelling.  The pain radiates from one of his upper rear teeth up into the left side of his face.  No facial swelling, no difficulty opening his mouth or swallowing.  No recent fever.  He has an appointment with his dentist in 1 to 2 days but he did not think he could wait that long because of the amount of discomfort.       Physical Exam   Triage Vital Signs: ED Triage Vitals  Encounter Vitals Group     BP 08/21/23 0103 (!) 171/85     Girls Systolic BP Percentile --      Girls Diastolic BP Percentile --      Boys Systolic BP Percentile --      Boys Diastolic BP Percentile --      Pulse Rate 08/21/23 0103 (!) 55     Resp 08/21/23 0103 18     Temp 08/21/23 0103 97.7 F (36.5 C)     Temp Source 08/21/23 0103 Oral     SpO2 08/21/23 0103 100 %     Weight 08/21/23 0102 102.1 kg (225 lb)     Height 08/21/23 0102 1.829 m (6')     Head Circumference --      Peak Flow --      Pain Score 08/21/23 0102 7     Pain Loc --      Pain Education --      Exclude from Growth Chart --     Most recent vital signs: Vitals:   08/21/23 0103  BP: (!) 171/85  Pulse: (!) 55  Resp: 18  Temp: 97.7 F (36.5 C)  SpO2: 100%    General: Awake, no distress, appears generally well. Face/eyes: No facial swelling.  No tenderness to palpation of the maxilla.  No infraorbital or periorbital swelling or  erythema to suggest facial cellulitis, preseptal cellulitis, nor orbital cellulitis.  No proptosis nor chemosis. Oropharynx: Patient has chronic dental changes in multiple teeth and some tenderness to palpation of several teeth in the left upper jaw, but without any 1 specific tooth that seems to cause the most discomfort.  He points at tooth #13 as the one that he thinks is causing the problem, but it does not appear different with no obvious periapical abscess, although the gum has receded more on that tooth than the surrounding wounds.  He has no evidence of Ludwig's angina, no obvious posterior oropharynx swelling, no other evidence of acute infection. CV:  Good peripheral perfusion.  Resp:  Normal effort. Speaking easily and comfortably, no accessory muscle usage nor intercostal retractions.   Abd:  No distention.    ED Results / Procedures / Treatments   Labs (all labs ordered are listed, but only abnormal results are displayed) Labs  Reviewed - No data to display     PROCEDURES:  Critical Care performed: No  Procedures    IMPRESSION / MDM / ASSESSMENT AND PLAN / ED COURSE  I reviewed the triage vital signs and the nursing notes.                              Differential diagnosis includes, but is not limited to, dental trauma, dental abscess, facial infection, gingivitis, preseptal cellulitis, orbital cellulitis.  Patient's presentation is most consistent with acute, uncomplicated illness.   Interventions/Medications given:  Medications  ketorolac  (TORADOL ) 30 MG/ML injection 30 mg (30 mg Intramuscular Given 08/21/23 0310)  lidocaine  (XYLOCAINE ) 2 % viscous mouth solution 15 mL (15 mLs Mouth/Throat Given 08/21/23 0310)  acetaminophen  (TYLENOL ) tablet 1,000 mg (1,000 mg Oral Given 08/21/23 0310)    (Note:  hospital course my include additional interventions and/or labs/studies not listed above.)   Patient may have a mild subacute or chronic dental infection that has been  improving over time and is now radiating pain into the left maxilla.  I talked with him about holding off until he sees his dentist that he would rather get started on some antibiotics which I think is reasonable given the probability he may need some antibiotics before any sort of dental procedure.  There is no evidence based on physical exam that he has a facial or orbital abscess or cellulitis.  I provided the medications listed above and prescriptions listed below to help with the symptoms after checking the [place]  controlled substance database and I strongly encouraged him to keep his appointment at the dental clinic as planned.  The patient's medical screening exam is reassuring with no indication of an emergent medical condition requiring hospitalization or additional evaluation at this point.  The patient is safe and appropriate for discharge and outpatient follow up.         FINAL CLINICAL IMPRESSION(S) / ED DIAGNOSES   Final diagnoses:  Pain, dental  Left facial pain     Rx / DC Orders   ED Discharge Orders          Ordered    HYDROcodone -acetaminophen  (NORCO/VICODIN) 5-325 MG tablet  Every 6 hours PRN        08/21/23 0257    magic mouthwash w/lidocaine  SOLN  4 times daily PRN       Note to Pharmacy: Please mix viscous lidocaine  2% with magic mouthwash solution so that the lidocaine  comprises approximately 25 % of the total solution.   08/21/23 0257    chlorhexidine  (PERIDEX ) 0.12 % solution  2 times daily        08/21/23 0257    amoxicillin -clavulanate (AUGMENTIN ) 875-125 MG tablet  2 times daily        08/21/23 0257             Note:  This document was prepared using Dragon voice recognition software and may include unintentional dictation errors.   Gordan Huxley, MD 08/21/23 [phone]

## 2023-08-21 NOTE — Discharge Instructions (Signed)
You have been seen in the Emergency Department (ED) today for dental pain.  Please take your prescribed antibiotic.  You may take pain medication as needed but ONLY as prescribed.  You should also take over-the-counter pain medication such as ibuprofen according to the label instructions unless a doctor has previously told you to avoid this type of medication (due to stomach ulcers, for example).  Alternatively you can take ibuprofen 600 mg by mouth three times daily with meals for no more than 5 days. ° °Take Norco as prescribed for severe pain. Do not drink alcohol, drive or participate in any other potentially dangerous activities while taking this medication as it may make you sleepy. Do not take this medication with any other sedating medications, either prescription or over-the-counter. If you were prescribed Percocet or Vicodin, do not take these with acetaminophen (Tylenol) as it is already contained within these medications. °  °This medication is an opiate (or narcotic) pain medication and can be habit forming.  Use it as little as possible to achieve adequate pain control.  Do not use or use it with extreme caution if you have a history of opiate abuse or dependence.  If you are on a pain contract with your primary care doctor or a pain specialist, be sure to let them know you were prescribed this medication today from the [facility].  This medication is intended for your use only - do not give any to anyone else and keep it in a secure place where nobody else, especially children, have access to it.  It will also cause or worsen constipation, so you may want to consider taking an over-the-counter stool softener while you are taking this medication. ° °Please see you dentist as soon as possible; only a dentist will be able to fix your problem(s).  Please see below for dental follow up options. ° °Return to the ED if you develop worsening pain, fever, pus/drainage, difficulty  breathing, or other symptoms that concern you. °

## 2023-10-15 ENCOUNTER — Other Ambulatory Visit: Payer: Self-pay | Admitting: Family Medicine

## 2023-10-15 DIAGNOSIS — F39 Unspecified mood [affective] disorder: Secondary | ICD-10-CM

## 2023-10-18 ENCOUNTER — Other Ambulatory Visit: Payer: Self-pay | Admitting: Family Medicine

## 2023-10-18 DIAGNOSIS — I10 Essential (primary) hypertension: Secondary | ICD-10-CM

## 2023-12-18 ENCOUNTER — Other Ambulatory Visit: Payer: Self-pay | Admitting: Urology

## 2023-12-20 ENCOUNTER — Ambulatory Visit: Admitting: Urology

## 2023-12-20 ENCOUNTER — Encounter: Payer: Self-pay | Admitting: Urology

## 2023-12-20 VITALS — BP 136/80 | HR 72 | Ht 72.0 in | Wt 225.0 lb

## 2023-12-20 DIAGNOSIS — N5201 Erectile dysfunction due to arterial insufficiency: Secondary | ICD-10-CM | POA: Diagnosis not present

## 2023-12-20 MED ORDER — TADALAFIL 20 MG PO TABS: ORAL_TABLET | ORAL | 3 refills | Status: AC

## 2023-12-20 NOTE — Progress Notes (Signed)
12/20/2023 11:30 AM   Brandon Montes 1955-08-09 [id number]  Referring provider: Donzella Lauraine SAILOR, DO [address] [place],  [place] [address]  Chief Complaint  Patient presents with   Medication Refill   Urologic history: 1.  Erectile dysfunction Initial visit 01/13/2023 Tadalafil  20 mg as needed  HPI: Brandon Montes is a 68 y.o. male presents for annual follow-up.  Seen last year for ED and was given a trial of tadalafil  which has been effective He does experience headache with the medication but is tolerable No bothersome LUTS   PMH: Past Medical History:  Diagnosis Date   Fatty liver    Gall stones     Surgical History: Past Surgical History:  Procedure Laterality Date   COLONOSCOPY WITH PROPOFOL  N/A 05/04/2015   Procedure: COLONOSCOPY WITH PROPOFOL ;  Surgeon: Gladis RAYMOND Mariner, MD;  Location: [facility] ENDOSCOPY;  Service: Endoscopy;  Laterality: N/A;   CYST EXCISION     from right wrist   CYSTECTOMY     TONSILLECTOMY     VASECTOMY      Home Medications:  Allergies as of 12/20/2023       Reactions   Shellfish Protein-containing Drug Products Nausea And Vomiting        Medication List        Accurate as of December 20, 2023 11:30 AM. If you have any questions, ask your nurse or doctor.          atorvastatin  20 MG tablet Commonly known as: LIPITOR Take 1 tablet (20 mg total) by mouth daily.   chlorhexidine  0.12 % solution Commonly known as: PERIDEX  Use as directed 15 mLs in the mouth or throat 2 (two) times daily.   escitalopram  20 MG tablet Commonly known as: LEXAPRO  TAKE 1 TABLET BY MOUTH EVERY DAY   HYDROcodone -acetaminophen  5-325 MG tablet Commonly known as: NORCO/VICODIN Take 2 tablets by mouth every 6 (six) hours as needed for moderate pain (pain score 4-6) or severe pain (pain score 7-10).   losartan  50 MG tablet Commonly known as: COZAAR  TAKE 1 TABLET BY MOUTH EVERY DAY   magic mouthwash w/lidocaine  Soln Take 5 mLs  by mouth 4 (four) times daily as needed for mouth pain. Swish and spit, do not swallow the solution.   tadalafil  20 MG tablet Commonly known as: CIALIS  Take 1 tab 1 hour prior to intercourse        Allergies:  Allergies  Allergen Reactions   Shellfish Protein-Containing Drug Products Nausea And Vomiting    Family History: Family History  Problem Relation Age of Onset   Diabetes Father    Renal cancer Father    Diabetes Paternal Grandfather     Social History:  reports that he has quit smoking. He has quit using smokeless tobacco. He reports that he does not drink alcohol and does not use drugs.   Physical Exam: BP 136/80   Pulse 72   Ht 6' (1.829 m)   Wt 225 lb (102.1 kg)   BMI 30.52 kg/m   Constitutional:  Alert, No acute distress. HEENT: [place] AT Respiratory: Normal respiratory effort, no increased work of breathing. Psychiatric: Normal mood and affect.   Assessment & Plan:    1.  Erectile dysfunction PDE 5 inhibitor effective Tadalafil  refilled Will schedule annual follow-up.  If his PCP will assume refilling the tadalafil  can see him back as needed   Brandon JAYSON Barba, MD  [facility] [place] [address] [place], [place]  [address] [phone] 

## 2024-01-13 ENCOUNTER — Other Ambulatory Visit: Payer: Self-pay | Admitting: Family Medicine

## 2024-01-13 DIAGNOSIS — F39 Unspecified mood [affective] disorder: Secondary | ICD-10-CM

## 2024-02-01 ENCOUNTER — Ambulatory Visit: Payer: Self-pay | Admitting: Family Medicine

## 2024-02-06 ENCOUNTER — Ambulatory Visit: Admitting: Family Medicine

## 2024-02-06 ENCOUNTER — Encounter: Payer: Self-pay | Admitting: Family Medicine

## 2024-02-06 ENCOUNTER — Ambulatory Visit

## 2024-02-06 VITALS — BP 130/81 | HR 61 | Temp 98.1°F | Ht 72.0 in | Wt 230.2 lb

## 2024-02-06 DIAGNOSIS — I10 Essential (primary) hypertension: Secondary | ICD-10-CM | POA: Diagnosis not present

## 2024-02-06 DIAGNOSIS — Z0000 Encounter for general adult medical examination without abnormal findings: Secondary | ICD-10-CM | POA: Diagnosis not present

## 2024-02-06 DIAGNOSIS — F419 Anxiety disorder, unspecified: Secondary | ICD-10-CM | POA: Diagnosis not present

## 2024-02-06 DIAGNOSIS — D34 Benign neoplasm of thyroid gland: Secondary | ICD-10-CM | POA: Insufficient documentation

## 2024-02-06 DIAGNOSIS — R7303 Prediabetes: Secondary | ICD-10-CM | POA: Diagnosis not present

## 2024-02-06 DIAGNOSIS — L989 Disorder of the skin and subcutaneous tissue, unspecified: Secondary | ICD-10-CM

## 2024-02-06 DIAGNOSIS — E782 Mixed hyperlipidemia: Secondary | ICD-10-CM | POA: Diagnosis not present

## 2024-02-06 DIAGNOSIS — N182 Chronic kidney disease, stage 2 (mild): Secondary | ICD-10-CM | POA: Diagnosis not present

## 2024-02-06 MED ORDER — ESCITALOPRAM OXALATE 10 MG PO TABS: 10.0000 mg | ORAL_TABLET | Freq: Every day | ORAL | 0 refills | Status: AC

## 2024-02-06 NOTE — Progress Notes (Signed)
 "    Complete physical exam   Patient: Brandon Montes   DOB: 13-Jul-1955   69 y.o. Male  MRN: [id number] Visit Date: 02/06/2024  Today's healthcare provider: LAURAINE LOISE BUOY, DO   Chief Complaint  Patient presents with   Annual Exam    Diet- General Exercise- 6 days a week Overall feeling- Great Sleep- Great  Concerns- A place on right shoulder that he would like for the provider to look at.   Subjective    Brandon Montes is a 69 y.o. male who presents today for a complete physical exam.   HPI HPI     Annual Exam    Additional comments: Diet- General Exercise- 6 days a week Overall feeling- Great Sleep- Great  Concerns- A place on right shoulder that he would like for the provider to look at.      Last edited by Terrel Powell CROME, CMA on 02/06/2024  9:50 AM.      Brandon Montes is a 69 year old male who presents for an annual physical exam with concerns about a chronic shoulder lesion.  He has a chronic lesion on his right shoulder that has been present for several years. The lesion becomes sore at times and feels raised, but otherwise has not changed in size or appearance. It is very sensitive when sore but not itchy. He does not recall how it initially started and has seen a dermatologist several years ago, though he does not remember the details of that visit.  He experiences knee pain and had an x-ray performed, which he recalls was interpreted as normal by the clinician. He engages in regular physical activity, going to the gym five days a week and staying active on weekends.  He is currently taking escitalopram  (Lexapro ) for mood and feels he is doing well on it. He has not been checking his blood pressure at home recently. He received the COVID booster this year. He denies any current memory concerns.    Past Medical History:  Diagnosis Date   Anxiety 03-2022   Taking medication   Fatty liver    Gall stones    Hypertension 03-2022   Taking medication    Past Surgical History:  Procedure Laterality Date   COLONOSCOPY WITH PROPOFOL  N/A 05/04/2015   Procedure: COLONOSCOPY WITH PROPOFOL ;  Surgeon: Gladis RAYMOND Mariner, MD;  Location: [facility] ENDOSCOPY;  Service: Endoscopy;  Laterality: N/A;   CYST EXCISION     from right wrist   CYSTECTOMY     TONSILLECTOMY     VASECTOMY     Social History   Socioeconomic History   Marital status: Married    Spouse name: Not on file   Number of children: 3   Years of education: Not on file   Highest education level: 12th grade  Occupational History   Occupation: [employer]  Tobacco Use   Smoking status: Former    Current packs/day: 0.00    Average packs/day: 1 pack/day for 15.0 years (15.0 ttl pk-yrs)    Types: Cigarettes    Quit date: 07/14/1995    Years since quitting: 28.5   Smokeless tobacco: Former  Building Services Engineer status: Never Used  Substance and Sexual Activity   Alcohol use: Never   Drug use: Never   Sexual activity: Yes    Birth control/protection: Surgical  Other Topics Concern   Not on file  Social History Narrative   Not on file   Social Drivers of  Health   Tobacco Use: Medium Risk (02/06/2024)   Patient History    Smoking Tobacco Use: Former    Smokeless Tobacco Use: Former    Passive Exposure: Not on Actuary Strain: Low Risk (02/03/2024)   Overall Financial Resource Strain (CARDIA)    Difficulty of Paying Living Expenses: Not hard at all  Food Insecurity: No Food Insecurity (02/06/2024)   Epic    Worried About Programme Researcher, Broadcasting/film/video in the Last Year: Never true    Ran Out of Food in the Last Year: Never true  Transportation Needs: No Transportation Needs (02/06/2024)   Epic    Lack of Transportation (Medical): No    Lack of Transportation (Non-Medical): No  Physical Activity: Sufficiently Active (02/06/2024)   Exercise Vital Sign    Days of Exercise per Week: 6 days    Minutes of Exercise per Session: 60 min  Stress: No Stress Concern Present  (02/03/2024)   [employer] of Occupational Health - Occupational Stress Questionnaire    Feeling of Stress: Not at all  Social Connections: Socially Integrated (02/06/2024)   Social Connection and Isolation Panel    Frequency of Communication with Friends and Family: More than three times a week    Frequency of Social Gatherings with Friends and Family: More than three times a week    Attends Religious Services: More than 4 times per year    Active Member of Clubs or Organizations: Yes    Attends Banker Meetings: More than 4 times per year    Marital Status: Married  Catering Manager Violence: Not At Risk (02/06/2024)   Epic    Fear of Current or Ex-Partner: No    Emotionally Abused: No    Physically Abused: No    Sexually Abused: No  Depression (PHQ2-9): Low Risk (02/06/2024)   Depression (PHQ2-9)    PHQ-2 Score: 0  Alcohol Screen: Low Risk (05/20/2021)   Alcohol Screen    Last Alcohol Screening Score (AUDIT): 0  Housing: Unknown (02/06/2024)   Epic    Unable to Pay for Housing in the Last Year: No    Number of Times Moved in the Last Year: Not on file    Homeless in the Last Year: No  Utilities: Not At Risk (02/06/2024)   Epic    Threatened with loss of utilities: No  Health Literacy: Adequate Health Literacy (02/06/2024)   B1300 Health Literacy    Frequency of need for help with medical instructions: Never   Family Status  Relation Name Status   Mother  Alive   Father huey scalia Deceased   Sister  Alive   Sister  Alive   Brother  Alive   Brother  Alive   Daughter  Alive   Daughter  Alive   Daughter  Alive   PGF Astronomer (Not Specified)  No partnership data on file   Family History  Problem Relation Age of Onset   Diabetes Father    Renal cancer Father    Cancer Father    Diabetes Paternal Grandfather    Allergies[1]  Patient Care Team: Trace Wirick, Lauraine SAILOR, DO as PCP - General (Family Medicine) [facility], [facility] (Optometry)    Medications: Show/hide medication list[2]  Review of Systems  Constitutional:  Negative for activity change, appetite change, chills, fatigue and fever.  HENT:  Negative for congestion, ear pain, hearing loss, nosebleeds and trouble swallowing.   Eyes:  Negative for pain and visual disturbance.  Respiratory:  Negative for cough, chest tightness and shortness of breath.   Cardiovascular:  Negative for chest pain, palpitations and leg swelling.  Gastrointestinal:  Negative for abdominal pain, blood in stool, constipation, diarrhea, nausea and vomiting.  Endocrine: Negative for polydipsia, polyphagia and polyuria.  Genitourinary:  Negative for dysuria and flank pain.  Musculoskeletal:  Positive for arthralgias (bilateral knees). Negative for back pain, joint swelling, myalgias and neck stiffness.  Skin:  Negative for color change, rash and wound.       +lesion to right shoulder  Neurological:  Negative for dizziness, tremors, seizures, speech difficulty, weakness, light-headedness and headaches.  Psychiatric/Behavioral:  Negative for behavioral problems, confusion, decreased concentration, dysphoric mood and sleep disturbance. The patient is not nervous/anxious.   All other systems reviewed and are negative.     Objective    BP 130/81 (BP Location: Right Arm, Patient Position: Sitting, Cuff Size: Normal)   Pulse 61   Temp 98.1 F (36.7 C) (Oral)   Ht 6' (1.829 m)   Wt 230 lb 3.2 oz (104.4 kg)   SpO2 97%   BMI 31.22 kg/m    Physical Exam Vitals and nursing note reviewed.  Constitutional:      General: He is awake.     Appearance: Normal appearance.  HENT:     Head: Normocephalic and atraumatic.     Right Ear: Tympanic membrane, ear canal and external ear normal.     Left Ear: Tympanic membrane, ear canal and external ear normal.     Nose: Nose normal.     Mouth/Throat:     Mouth: Mucous membranes are moist.     Pharynx: Oropharynx is clear. No oropharyngeal exudate or  posterior oropharyngeal erythema.  Eyes:     General: No scleral icterus.    Extraocular Movements: Extraocular movements intact.     Conjunctiva/sclera: Conjunctivae normal.     Pupils: Pupils are equal, round, and reactive to light.  Neck:     Thyroid : Thyromegaly (Right-sided; previously evaluated and noted to be a benign follicular tumor) present. No thyroid  tenderness.  Cardiovascular:     Rate and Rhythm: Normal rate and regular rhythm.     Pulses: Normal pulses.     Heart sounds: Normal heart sounds.  Pulmonary:     Effort: Pulmonary effort is normal. No tachypnea, bradypnea or respiratory distress.     Breath sounds: Normal breath sounds. No stridor. No wheezing, rhonchi or rales.  Abdominal:     General: Bowel sounds are normal. There is no distension.     Palpations: Abdomen is soft. There is no mass.     Tenderness: There is no abdominal tenderness. There is no guarding.     Hernia: No hernia is present.  Musculoskeletal:     Cervical back: Normal range of motion and neck supple.     Right lower leg: No edema.     Left lower leg: No edema.  Lymphadenopathy:     Cervical: No cervical adenopathy.  Skin:    General: Skin is warm and dry.  Neurological:     Mental Status: He is alert and oriented to person, place, and time. Mental status is at baseline.  Psychiatric:        Mood and Affect: Mood normal.        Behavior: Behavior normal.      Last depression screening scores    02/06/2024    9:55 AM 02/06/2024    9:12 AM 08/01/2023    1:04 PM  PHQ  2/9 Scores  PHQ - 2 Score 0 0 0  PHQ- 9 Score 0 0 0      Data saved with a previous flowsheet row definition   Last fall risk screening    02/06/2024    9:55 AM  Fall Risk   Falls in the past year? 0  Number falls in past yr: 0  Risk for fall due to : No Fall Risks   Last Audit-C alcohol use screening    02/03/2024    9:31 PM  Alcohol Use Disorder Test (AUDIT)  1. How often do you have a drink containing  alcohol? 0   3. How often do you have six or more drinks on one occasion? 0      Manually entered by patient   A score of 3 or more in women, and 4 or more in men indicates increased risk for alcohol abuse, EXCEPT if all of the points are from question 1   No results found for any visits on 02/06/24.  Assessment & Plan    Routine Health Maintenance and Physical Exam  Exercise Activities and Dietary recommendations  Goals      DIET - EAT MORE FRUITS AND VEGETABLES     Weight (lb) < 215 lb (97.5 kg)     Goal weight between 210 and 215 lbs. Try to achieve goal within upcoming six months.        Immunization History  Administered Date(s) Administered   INFLUENZA, HIGH DOSE SEASONAL PF 10/05/2022, 10/19/2023   Influenza Inj Mdck Quad Pf 11/08/2021   Influenza,inj,Quad PF,6+ Mos 04/08/2016   Influenza-Unspecified 10/17/2022   Moderna Covid-19 Fall Seasonal Vaccine 46yrs & older 11/08/2021   Moderna Covid-19 Vaccine Bivalent Booster 86yrs & up 11/29/2020   Moderna Sars-Covid-2 Vaccination 05/03/2019, 05/31/2019, 01/12/2020   PNEUMOCOCCAL CONJUGATE-20 11/01/2022   Pfizer(Comirnaty)Fall Seasonal Vaccine 12 years and older 10/05/2022   Td 04/02/2015   Tdap 08/11/2005   Zoster Recombinant(Shingrix ) 05/24/2022, 11/01/2022    Health Maintenance  Topic Date Due   COVID-19 Vaccine (7 - 2025-26 season) 09/23/2024 (Originally 09/25/2023)   Medicare Annual Wellness (AWV)  02/05/2025   DTaP/Tdap/Td (3 - Td or Tdap) 04/01/2025   Colonoscopy  05/27/2027   Pneumococcal Vaccine: 50+ Years  Completed   Influenza Vaccine  Completed   Hepatitis C Screening  Completed   Zoster Vaccines- Shingrix   Completed   Meningococcal B Vaccine  Aged Out    Discussed health benefits of physical activity, and encouraged him to engage in regular exercise appropriate for his age and condition.   Annual physical exam  Primary hypertension -     Comprehensive metabolic panel with GFR -     Lipid Panel  With LDL/HDL Ratio  Prediabetes -     Comprehensive metabolic panel with GFR -     Lipid Panel With LDL/HDL Ratio -     Hemoglobin A1c  Mixed hyperlipidemia -     Lipid Panel With LDL/HDL Ratio  Chronic kidney disease, stage 2, mildly decreased GFR  Skin lesion -     Ambulatory referral to Dermatology  Anxiety -     Escitalopram  Oxalate; Take 1 tablet (10 mg total) by mouth daily.  Dispense: 90 tablet; Refill: 0  Benign follicular tumor of thyroid  gland Assessment & Plan: Noted.  Patient denies change in size of enlarged region.  No acute concerns.  Continue to monitor.       Annual physical exam Physical exam overall unremarkable except as noted above. Routine  lab work ordered as noted.  Received COVID booster. Discussed RSV vaccination due to age-related risk. - Recommended RSV vaccination.  Skin lesion Chronic lesion of right shoulder with intermittent elevation from surface of skin and associated irritation.  Patient interested in further evaluation. - Photographed lesion for medical record. - Referred to dermatology for further evaluation and recommendations.  Primary hypertension Chronic, borderline elevated blood pressure. No recent home monitoring. Regular physical activity noted. - Encouraged home blood pressure monitoring. - Continue losartan  50 mg daily  Anxiety  Chronic, well-managed with escitalopram  20 mg daily. Patient feels well and interested in tapering off. No current anxiety symptoms. - Decrease escitalopram  to 10 mg daily and monitor response. - Consider discontinuation if well-managed on 10 mg.  Chronic kidney disease, stage 2, mildly decreased GFR Noted.  No acute concerns.  Continue to optimize risk factors.  Mixed hyperlipidemia Chronic, managed with atorvastatin  20 mg daily.  Recheck lipid panel today.  Prediabetes Noted.  No acute concerns.  Continue lifestyle modifications with low carbohydrate diet and regular physical  activity.    Return in about 6 months (around 08/05/2024) for Chronic f/u w/next provider.     I discussed the assessment and treatment plan with the patient  The patient was provided an opportunity to ask questions and all were answered. The patient agreed with the plan and demonstrated an understanding of the instructions.   The patient was advised to call back or seek an in-person evaluation if the symptoms worsen or if the condition fails to improve as anticipated.    LAURAINE LOISE BUOY, DO  [facility] [phone] (phone) [phone] (fax)  [facility]    [1]  Allergies Allergen Reactions   Shellfish Protein-Containing Drug Products Nausea And Vomiting  [2]  Outpatient Medications Prior to Visit  Medication Sig   atorvastatin  (LIPITOR) 20 MG tablet Take 1 tablet (20 mg total) by mouth daily.   chlorhexidine  (PERIDEX ) 0.12 % solution Use as directed 15 mLs in the mouth or throat 2 (two) times daily.   losartan  (COZAAR ) 50 MG tablet TAKE 1 TABLET BY MOUTH EVERY DAY   meloxicam  (MOBIC ) 15 MG tablet Take 15 mg by mouth daily as needed for pain.   tadalafil  (CIALIS ) 20 MG tablet Take 1 tab 1 hour prior to intercourse   [DISCONTINUED] escitalopram  (LEXAPRO ) 20 MG tablet TAKE 1 TABLET BY MOUTH EVERY DAY   [DISCONTINUED] magic mouthwash w/lidocaine  SOLN Take 5 mLs by mouth 4 (four) times daily as needed for mouth pain. Swish and spit, do not swallow the solution.   [DISCONTINUED] HYDROcodone -acetaminophen  (NORCO/VICODIN) 5-325 MG tablet Take 2 tablets by mouth every 6 (six) hours as needed for moderate pain (pain score 4-6) or severe pain (pain score 7-10). (Patient not taking: Reported on 02/06/2024)   No facility-administered medications prior to visit.   "

## 2024-02-06 NOTE — Assessment & Plan Note (Signed)
 Noted.  Patient denies change in size of enlarged region.  No acute concerns.  Continue to monitor.

## 2024-02-06 NOTE — Progress Notes (Signed)
 "  No chief complaint on file.    Subjective:   Brandon Montes is a 69 y.o. male who presents for a Medicare Annual Wellness Visit.  Visit info / Clinical Intake: Medicare Wellness Visit Type:: Initial Annual Wellness Visit Persons participating in visit and providing information:: patient Medicare Wellness Visit Mode:: Telephone If telephone:: video declined Since this visit was completed virtually, some vitals may be partially provided or unavailable. Missing vitals are due to the limitations of the virtual format.: Unable to obtain vitals - no equipment If Telephone or Video please confirm:: I connected with patient using audio/video enable telemedicine. I verified patient identity with two identifiers, discussed telehealth limitations, and patient agreed to proceed. Patient Location:: home Provider Location:: office Interpreter Needed?: No Pre-visit prep was completed: yes AWV questionnaire completed by patient prior to visit?: yes Date:: 02/03/24 Living arrangements:: (Patient-Rptd) lives with spouse/significant other Patient's Overall Health Status Rating: (Patient-Rptd) very good Typical amount of pain: (Patient-Rptd) some Does pain affect daily life?: (Patient-Rptd) no Are you currently prescribed opioids?: no  Dietary Habits and Nutritional Risks How many meals a day?: (Patient-Rptd) 3 Eats fruit and vegetables daily?: (Patient-Rptd) yes Most meals are obtained by: (Patient-Rptd) preparing own meals In the last 2 weeks, have you had any of the following?: none Diabetic:: no  Functional Status Activities of Daily Living (to include ambulation/medication): (Patient-Rptd) Independent Ambulation: (Patient-Rptd) Independent Medication Administration: (Patient-Rptd) Independent Home Management (perform basic housework or laundry): (Patient-Rptd) Independent Manage your own finances?: (Patient-Rptd) yes Primary transportation is: (Patient-Rptd) driving Concerns about  vision?: no *vision screening is required for WTM* (RX GLASSES- [facility]) Concerns about hearing?: (!) yes Uses hearing aids?: (!) yes Hear whispered voice?: yes  Fall Screening Falls in the past year?: (Patient-Rptd) 0 Number of falls in past year: 0 Was there an injury with Fall?: 0 Fall Risk Category Calculator: 0 Patient Fall Risk Level: Low Fall Risk  Fall Risk Patient at Risk for Falls Due to: No Fall Risks Fall risk Follow up: Falls evaluation completed; Falls prevention discussed  Home and Transportation Safety: All rugs have non-skid backing?: (Patient-Rptd) yes All stairs or steps have railings?: (Patient-Rptd) yes Grab bars in the bathtub or shower?: (!) (Patient-Rptd) no Have non-skid surface in bathtub or shower?: (!) (Patient-Rptd) no Good home lighting?: (Patient-Rptd) yes Regular seat belt use?: (Patient-Rptd) yes Hospital stays in the last year:: (Patient-Rptd) no  Cognitive Assessment Difficulty concentrating, remembering, or making decisions? : yes (MEMORY) Will 6CIT or Mini Cog be Completed: yes What year is it?: 0 points What month is it?: 0 points Give patient an address phrase to remember (5 components): [address] [place],  About what time is it?: 0 points Count backwards from 20 to 1: 0 points Say the months of the year in reverse: 0 points Repeat the address phrase from earlier: 0 points 6 CIT Score: 0 points  Advance Directives (For Healthcare) Does Patient Have a Medical Advance Directive?: No Would patient like information on creating a medical advance directive?: No - Patient declined  Reviewed/Updated  Reviewed/Updated: Reviewed All (Medical, Surgical, Family, Medications, Allergies, Care Teams, Patient Goals)    Allergies (verified) Shellfish protein-containing drug products   Current Medications (verified) Outpatient Encounter Medications as of 02/06/2024  Medication Sig   atorvastatin  (LIPITOR) 20 MG tablet Take 1 tablet  (20 mg total) by mouth daily.   chlorhexidine  (PERIDEX ) 0.12 % solution Use as directed 15 mLs in the mouth or throat 2 (two) times daily.  escitalopram  (LEXAPRO ) 20 MG tablet TAKE 1 TABLET BY MOUTH EVERY DAY   losartan  (COZAAR ) 50 MG tablet TAKE 1 TABLET BY MOUTH EVERY DAY   magic mouthwash w/lidocaine  SOLN Take 5 mLs by mouth 4 (four) times daily as needed for mouth pain. Swish and spit, do not swallow the solution.   meloxicam  (MOBIC ) 15 MG tablet Take 15 mg by mouth daily as needed for pain.   tadalafil  (CIALIS ) 20 MG tablet Take 1 tab 1 hour prior to intercourse   HYDROcodone -acetaminophen  (NORCO/VICODIN) 5-325 MG tablet Take 2 tablets by mouth every 6 (six) hours as needed for moderate pain (pain score 4-6) or severe pain (pain score 7-10). (Patient not taking: Reported on 02/06/2024)   No facility-administered encounter medications on file as of 02/06/2024.    History: Past Medical History:  Diagnosis Date   Fatty liver    Gall stones    Past Surgical History:  Procedure Laterality Date   COLONOSCOPY WITH PROPOFOL  N/A 05/04/2015   Procedure: COLONOSCOPY WITH PROPOFOL ;  Surgeon: Gladis RAYMOND Mariner, MD;  Location: [facility] ENDOSCOPY;  Service: Endoscopy;  Laterality: N/A;   CYST EXCISION     from right wrist   CYSTECTOMY     TONSILLECTOMY     VASECTOMY     Family History  Problem Relation Age of Onset   Diabetes Father    Renal cancer Father    Diabetes Paternal Grandfather    Social History   Occupational History   Occupation: [employer]  Tobacco Use   Smoking status: Former   Smokeless tobacco: Former  Building Services Engineer status: Never Used  Substance and Sexual Activity   Alcohol use: No    Alcohol/week: 0.0 standard drinks of alcohol   Drug use: No   Sexual activity: Not on file   Tobacco Counseling Counseling given: Not Answered  SDOH Screenings   Food Insecurity: No Food Insecurity (02/06/2024)  Housing: Unknown (02/06/2024)  Transportation Needs: No  Transportation Needs (02/06/2024)  Utilities: Not At Risk (02/06/2024)  Alcohol Screen: Low Risk (05/20/2021)  Depression (PHQ2-9): Low Risk (02/06/2024)  Financial Resource Strain: Low Risk (02/03/2024)  Physical Activity: Sufficiently Active (02/06/2024)  Social Connections: Socially Integrated (02/06/2024)  Stress: No Stress Concern Present (02/03/2024)  Tobacco Use: Medium Risk (02/06/2024)  Health Literacy: Adequate Health Literacy (02/06/2024)   See flowsheets for full screening details  Depression Screen PHQ 2 & 9 Depression Scale- Over the past 2 weeks, how often have you been bothered by any of the following problems? Little interest or pleasure in doing things: 0 Feeling down, depressed, or hopeless (PHQ Adolescent also includes...irritable): 0 PHQ-2 Total Score: 0 Trouble falling or staying asleep, or sleeping too much: 0 Feeling tired or having little energy: 0 Poor appetite or overeating (PHQ Adolescent also includes...weight loss): 0 Feeling bad about yourself - or that you are a failure or have let yourself or your family down: 0 Trouble concentrating on things, such as reading the newspaper or watching television (PHQ Adolescent also includes...like school work): 0 Moving or speaking so slowly that other people could have noticed. Or the opposite - being so fidgety or restless that you have been moving around a lot more than usual: 0 Thoughts that you would be better off dead, or of hurting yourself in some way: 0 PHQ-9 Total Score: 0 If you checked off any problems, how difficult have these problems made it for you to do your work, take care of things at home, or get along  with other people?: Not difficult at all  Depression Treatment Depression Interventions/Treatment : EYV7-0 Score <4 Follow-up Not Indicated     Goals Addressed             This Visit's Progress    DIET - EAT MORE FRUITS AND VEGETABLES               Objective:    There were no vitals filed for  this visit. There is no height or weight on file to calculate BMI.  Hearing/Vision screen Hearing Screening - Comments:: WEARS AIDS Vision Screening - Comments:: RX GLASSES- [facility]- EVERY OTHER YEAR Immunizations and Health Maintenance Health Maintenance  Topic Date Due   COVID-19 Vaccine (7 - 2025-26 season) 09/25/2023   Medicare Annual Wellness (AWV)  02/05/2025   DTaP/Tdap/Td (3 - Td or Tdap) 04/01/2025   Colonoscopy  05/27/2027   Pneumococcal Vaccine: 50+ Years  Completed   Influenza Vaccine  Completed   Hepatitis C Screening  Completed   Zoster Vaccines- Shingrix   Completed   Meningococcal B Vaccine  Aged Out        Assessment/Plan:  This is a routine wellness examination for Brandon [facility].  Patient Care Team: Donzella Lauraine SAILOR, DO as PCP - General (Family Medicine) [place], [facility])  I have personally reviewed and noted the following in the patients chart:   Medical and social history Use of alcohol, tobacco or illicit drugs  Current medications and supplements including opioid prescriptions. Functional ability and status Nutritional status Physical activity Advanced directives List of other physicians Hospitalizations, surgeries, and ER visits in previous 12 months Vitals Screenings to include cognitive, depression, and falls Referrals and appointments  No orders of the defined types were placed in this encounter.  In addition, I have reviewed and discussed with patient certain preventive protocols, quality metrics, and best practice recommendations. A written personalized care plan for preventive services as well as general preventive health recommendations were provided to patient.   Brandon GORMAN Das, LPN   [id number]/86/7973   Return in 1 year (on 02/05/2025).  After Visit Summary: (MyChart) Due to this being a telephonic visit, the after visit summary with patients personalized plan was offered to patient via MyChart   Nurse Notes: UTD ON SHOTS; UTD  ON COLONOSCOPY   "

## 2024-02-06 NOTE — Patient Instructions (Addendum)
 Brandon Montes,  Thank you for taking the time for your Medicare Wellness Visit. I appreciate your continued commitment to your health goals. Please review the care plan we discussed, and feel free to reach out if I can assist you further.  Please note that Annual Wellness Visits do not include a physical exam. Some assessments may be limited, especially if the visit was conducted virtually. If needed, we may recommend an in-person follow-up with your provider.  Ongoing Care Seeing your primary care provider every 3 to 6 months helps us  monitor your health and provide consistent, personalized care. APPT FOR PHYSICAL W/ DR.PARDUE ON 02/06/24 @ 9:40 AM  Referrals If a referral was made during today's visit and you haven't received any updates within two weeks, please contact the referred provider directly to check on the status.  Recommended Screenings:  Health Maintenance  Topic Date Due   COVID-19 Vaccine (7 - 2025-26 season) 09/25/2023   Medicare Annual Wellness Visit  02/05/2025   DTaP/Tdap/Td vaccine (3 - Td or Tdap) 04/01/2025   Colon Cancer Screening  05/27/2027   Pneumococcal Vaccine for age over 20  Completed   Flu Shot  Completed   Hepatitis C Screening  Completed   Zoster (Shingles) Vaccine  Completed   Meningitis B Vaccine  Aged Out     Vision: Annual vision screenings are recommended for early detection of glaucoma, cataracts, and diabetic retinopathy. These exams can also reveal signs of chronic conditions such as diabetes and high blood pressure.  Dental: Annual dental screenings help detect early signs of oral cancer, gum disease, and other conditions linked to overall health, including heart disease and diabetes.  Please see the attached documents for additional preventive care recommendations.   NEXT AWV 02/05/25 @ 3:10 PM IN PERSON

## 2024-02-08 LAB — COMPREHENSIVE METABOLIC PANEL WITH GFR
ALT: 34 IU/L (ref 0–44)
AST: 34 IU/L (ref 0–40)
Albumin: 4.5 g/dL (ref 3.9–4.9)
Alkaline Phosphatase: 66 IU/L (ref 47–123)
BUN/Creatinine Ratio: 20 (ref 10–24)
BUN: 20 mg/dL (ref 8–27)
Bilirubin Total: 0.9 mg/dL (ref 0.0–1.2)
CO2: 24 mmol/L (ref 20–29)
Calcium: 9.4 mg/dL (ref 8.6–10.2)
Chloride: 102 mmol/L (ref 96–106)
Creatinine, Ser: 1.02 mg/dL (ref 0.76–1.27)
Globulin, Total: 2.1 g/dL (ref 1.5–4.5)
Glucose: 97 mg/dL (ref 70–99)
Potassium: 5.1 mmol/L (ref 3.5–5.2)
Sodium: 139 mmol/L (ref 134–144)
Total Protein: 6.6 g/dL (ref 6.0–8.5)
eGFR: 81 mL/min/1.73

## 2024-02-08 LAB — LIPID PANEL WITH LDL/HDL RATIO
Cholesterol, Total: 82 mg/dL — ABNORMAL LOW (ref 100–199)
HDL: 37 mg/dL — ABNORMAL LOW
LDL Chol Calc (NIH): 32 mg/dL (ref 0–99)
LDL/HDL Ratio: 0.9 ratio (ref 0.0–3.6)
Triglycerides: 53 mg/dL (ref 0–149)
VLDL Cholesterol Cal: 13 mg/dL (ref 5–40)

## 2024-02-08 LAB — HEMOGLOBIN A1C
Est. average glucose Bld gHb Est-mCnc: 120 mg/dL
Hgb A1c MFr Bld: 5.8 % — ABNORMAL HIGH (ref 4.8–5.6)

## 2024-02-27 ENCOUNTER — Ambulatory Visit: Payer: Self-pay | Admitting: Family Medicine

## 2024-02-27 ENCOUNTER — Encounter: Payer: Self-pay | Admitting: Dermatology

## 2024-02-27 ENCOUNTER — Ambulatory Visit: Admitting: Dermatology

## 2024-02-27 DIAGNOSIS — C4491 Basal cell carcinoma of skin, unspecified: Secondary | ICD-10-CM

## 2024-02-27 DIAGNOSIS — W908XXA Exposure to other nonionizing radiation, initial encounter: Secondary | ICD-10-CM

## 2024-02-27 DIAGNOSIS — D34 Benign neoplasm of thyroid gland: Secondary | ICD-10-CM | POA: Diagnosis not present

## 2024-02-27 DIAGNOSIS — C44612 Basal cell carcinoma of skin of right upper limb, including shoulder: Secondary | ICD-10-CM | POA: Diagnosis not present

## 2024-02-27 DIAGNOSIS — L578 Other skin changes due to chronic exposure to nonionizing radiation: Secondary | ICD-10-CM | POA: Diagnosis not present

## 2024-02-27 DIAGNOSIS — D485 Neoplasm of uncertain behavior of skin: Secondary | ICD-10-CM

## 2024-02-27 HISTORY — DX: Basal cell carcinoma of skin, unspecified: C44.91

## 2024-02-27 NOTE — Patient Instructions (Signed)
 Biopsy Wound Care Instructions  Leave the original bandage on for 24 hours if possible.  If the bandage becomes soaked or soiled before that time, it is OK to remove it and examine the wound.  A small amount of post-operative bleeding is normal.  If excessive bleeding occurs, remove the bandage, place gauze over the site and apply continuous pressure (no peeking) over the area for 30 minutes. If this does not work, please call our clinic as soon as possible or page your doctor if it is after hours.   Once a day, cleanse the wound with soap and water. It is fine to shower.   After washing, apply petroleum jelly (Vaseline) or an antibiotic ointment if your doctor prescribed one for you, followed by a bandage.    For best healing, the wound should be covered with a layer of ointment at all times. If you are not able to keep the area covered with a bandage to hold the ointment in place, this may mean re-applying the ointment several times a day.  Continue this wound care until the wound has healed and is no longer open.   Itching and mild discomfort is normal during the healing process. However, if you develop pain or severe itching, please call our office.   If you have any discomfort, you can take Tylenol (acetaminophen) or ibuprofen as directed on the bottle. (Please do not take these if you have an allergy to them or cannot take them for another reason).  Some redness, tenderness and white or yellow material in the wound is normal healing.  If the area becomes very sore and red, or develops a thick yellow-green material (pus), it may be infected; please notify us .    If you have stitches, return to clinic as directed to have the stitches removed. You will continue wound care for 2-3 days after the stitches are removed.   Wound healing continues for up to one year following surgery. It is not unusual to experience pain in the scar from time to time during the interval.  If the pain becomes severe or  the scar thickens, you should notify the office.    A slight amount of redness in a scar is expected for the first six months.  After six months, the redness will fade and the scar will soften and fade.  The color difference becomes less noticeable with time.  If there are any problems, return for a post-op surgery check at your earliest convenience.  To improve the appearance of the scar, you can use silicone scar gel, cream, or sheets (such as Mederma or Serica) every night for up to one year. These are available over the counter (without a prescription).  Please call our office at [phone] for any questions or concerns.  Due to recent changes in healthcare laws, you may see results of your pathology and/or laboratory studies on MyChart before the doctors have had a chance to review them. We understand that in some cases there may be results that are confusing or concerning to you. Please understand that not all results are received at the same time and often the doctors may need to interpret multiple results in order to provide you with the best plan of care or course of treatment. Therefore, we ask that you please give us  2 business days to thoroughly review all your results before contacting the office for clarification. Should we see a critical lab result, you will be contacted sooner.   If  You Need Anything After Your Visit  If you have any questions or concerns for your doctor, please call our main line at [phone] and press option 4 to reach your doctor's medical assistant. If no one answers, please leave a voicemail as directed and we will return your call as soon as possible. Messages left after 4 pm will be answered the following business day.   You may also send us  a message via MyChart. We typically respond to MyChart messages within 1-2 business days.  For prescription refills, please ask your pharmacy to contact our office. Our fax number is [phone].  If you have an  urgent issue when the clinic is closed that cannot wait until the next business day, you can page your doctor at the number below.    Please note that while we do our best to be available for urgent issues outside of office hours, we are not available 24/7.   If you have an urgent issue and are unable to reach us , you may choose to seek medical care at your doctor's office, retail clinic, urgent care center, or emergency room.  If you have a medical emergency, please immediately call 911 or go to the emergency department.  Pager Numbers  - Dr. Hester: [phone]  - Dr. Jackquline: [phone]  - Dr. Claudene: [phone]   - Dr. Raymund: [phone]  In the event of inclement weather, please call our main line at [phone] for an update on the status of any delays or closures.  Dermatology Medication Tips: Please keep the boxes that topical medications come in in order to help keep track of the instructions about where and how to use these. Pharmacies typically print the medication instructions only on the boxes and not directly on the medication tubes.   If your medication is too expensive, please contact our office at [phone] option 4 or send us  a message through MyChart.   We are unable to tell what your co-pay for medications will be in advance as this is different depending on your insurance coverage. However, we may be able to find a substitute medication at lower cost or fill out paperwork to get insurance to cover a needed medication.   If a prior authorization is required to get your medication covered by your insurance company, please allow us  1-2 business days to complete this process.  Drug prices often vary depending on where the prescription is filled and some pharmacies may offer cheaper prices.  The website www.goodrx.com contains coupons for medications through different pharmacies. The prices here do not account for what the cost may be with help from insurance  (it may be cheaper with your insurance), but the website can give you the price if you did not use any insurance.  - You can print the associated coupon and take it with your prescription to the pharmacy.  - You may also stop by our office during regular business hours and pick up a GoodRx coupon card.  - If you need your prescription sent electronically to a different pharmacy, notify our office through [facility] or by phone at [phone] option 4.     Si Usted Necesita Algo Despus de Su Visita  Tambin puede enviarnos un mensaje a travs de Clinical Cytogeneticist. Por lo general respondemos a los mensajes de MyChart en el transcurso de 1 a 2 das hbiles.  Para renovar recetas, por favor pida a su farmacia que se ponga en contacto con nuestra oficina. Nuestro nmero de fax  es el [phone].  Si tiene un asunto urgente cuando la clnica est cerrada y que no puede esperar hasta el siguiente da hbil, puede llamar/localizar a su doctor(a) al nmero que aparece a continuacin.   Por favor, tenga en cuenta que aunque hacemos todo lo posible para estar disponibles para asuntos urgentes fuera del horario de [place], no estamos disponibles las 24 horas del da, los 7 [address]  [address] de la [place].   Si tiene un problema urgente y no puede comunicarse con nosotros, puede optar por buscar atencin mdica  en el consultorio de su doctor(a), en una clnica privada, en un centro de atencin urgente o en una sala de emergencias.  Si tiene engineer, drilling, por favor llame inmediatamente al 911 o vaya a la sala de emergencias.  Nmeros de bper  - Dr. Hester: [phone]  - Dra. Jackquline: [phone]  - Dr. Claudene: [phone]  - Dra. Kitts: [phone]  En caso de inclemencias del [place], por favor llame a nuestra lnea principal al [phone] para una actualizacin sobre el estado de cualquier retraso o cierre.  Consejos para la medicacin en dermatologa: Por favor, guarde las cajas en las  que vienen los medicamentos de uso tpico para ayudarle a seguir las instrucciones sobre dnde y cmo usarlos. Las farmacias generalmente imprimen las instrucciones del medicamento slo en las cajas y no directamente en los tubos del [place].   Si su medicamento es muy caro, por favor, pngase en contacto con landry rieger llamando al [phone] y presione la opcin 4 o envenos un mensaje a travs de Clinical Cytogeneticist.   No podemos decirle cul ser su copago por los medicamentos por adelantado ya que esto es diferente dependiendo de la cobertura de su seguro. Sin embargo, es posible que podamos encontrar un medicamento sustituto a audiological scientist un formulario para que el seguro cubra el medicamento que se considera necesario.   Si se requiere una autorizacin previa para que su compaa de seguros cubra su medicamento, por favor permtanos de 1 a 2 das hbiles para completar este proceso.  Los precios de los medicamentos varan con frecuencia dependiendo del environmental consultant de dnde se surte la receta y alguna farmacias pueden ofrecer precios ms baratos.  El sitio web www.goodrx.com tiene cupones para medicamentos de health and safety inspector. Los precios aqu no tienen en cuenta lo que podra costar con la ayuda del seguro (puede ser ms barato con su seguro), pero el sitio web puede darle el precio si no utiliz tourist information centre manager.  - Puede imprimir el cupn correspondiente y llevarlo con su receta a la farmacia.  - Tambin puede pasar por nuestra oficina durante el horario de atencin regular y education officer, museum una tarjeta de cupones de GoodRx.  - Si necesita que su receta se enve electrnicamente a una farmacia diferente, informe a nuestra oficina a travs de MyChart de  o por telfono llamando al [phone] y presione la opcin 4.

## 2024-02-27 NOTE — Progress Notes (Signed)
" ° °  New Patient Visit   Subjective  Brandon Montes is a 69 y.o. male who presents for the following: spot at right shoulder, present for several years. Flares sometimes and has bled. Patient with no hx skin cancer. No fhx skin cancer.    The following portions of the chart were reviewed this encounter and updated as appropriate: medications, allergies, medical history  Review of Systems:  No other skin or systemic complaints except as noted in HPI or Assessment and Plan.  Objective  Well appearing patient in no apparent distress; mood and affect are within normal limits.   A focused examination was performed of the following areas: Right shoulder  Relevant exam findings are noted in the Assessment and Plan.  right superior shoulder 1.0 cm pink plaque with telangiectasias   Assessment & Plan    ACTINIC DAMAGE - chronic, secondary to cumulative UV radiation exposure/sun exposure over time - diffuse scaly erythematous macules with underlying dyspigmentation - Recommend daily broad spectrum sunscreen SPF 30+ to sun-exposed areas, reapply every 2 hours as needed.  - Recommend staying in the shade or wearing long sleeves, sun glasses (UVA+UVB protection) and wide brim hats (4-inch brim around the entire circumference of the hat). - Call for new or changing lesions.  Prominence of thyroid  gland on right neck Exam: soft mobile nodule R neck  Plan: Patient reports evaluated by PCP in the past 2019 FNA shows benign follicular nodule Seek medical attention if growing, painful, restricting breathing. Patient will discuss with PCP at next visit NEOPLASM OF UNCERTAIN BEHAVIOR OF SKIN right superior shoulder - Skin / nail biopsy Type of biopsy: tangential   Informed consent: discussed and consent obtained   Timeout: patient name, date of birth, surgical site, and procedure verified   Procedure prep:  Patient was prepped and draped in usual sterile fashion Prep type:  Isopropyl  alcohol Anesthesia: the lesion was anesthetized in a standard fashion   Anesthetic:  1% lidocaine  w/ epinephrine 1-100,000 buffered w/ 8.4% NaHCO3 Instrument used: DermaBlade   Hemostasis achieved with: pressure and aluminum chloride   Outcome: patient tolerated procedure well   Post-procedure details: sterile dressing applied and wound care instructions given   Dressing type: bandage and petrolatum    Specimen 1 - Surgical pathology Differential Diagnosis: BCC  Check Margins: No ACTINIC ELASTOSIS   BENIGN FOLLICULAR TUMOR OF THYROID  GLAND    Return if symptoms worsen or fail to improve.  LILLETTE Lonell Drones, RMA, am acting as scribe for Boneta Sharps, MD .   Documentation: I have reviewed the above documentation for accuracy and completeness, and I agree with the above.  Boneta Sharps, MD    "

## 2024-02-28 LAB — SURGICAL PATHOLOGY

## 2024-02-29 ENCOUNTER — Ambulatory Visit: Payer: Self-pay | Admitting: Dermatology

## 2024-02-29 ENCOUNTER — Encounter: Payer: Self-pay | Admitting: Dermatology

## 2024-02-29 NOTE — Telephone Encounter (Signed)
-----   Message from Boneta Sharps, MD sent at 02/29/2024  9:47 AM EST ----- Diagnosis: right superior shoulder :       BASAL CELL CARCINOMA, NODULAR PATTERN   Please call to share diagnosis and discuss treatment options and schedule FBSE  Explanation: your biopsy shows basal cell skin cancer in the second layer of the skin. This is the most common kind of skin cancer and is caused by damage from sun exposure. Basal cell skin cancers  almost never spread beyond the skin, so they are not dangerous to your overall health. However, they will continue to grow, can bleed, cause nonhealing wounds, and disrupt nearby structures unless  fully treated.   Treatment: Excision - you return for an hour long appointment in our clinic where we perform a skin surgery. We numb the site of the skin cancer and a safety margin of normal skin around it. We  remove the full thickness of skin and close the wound with two layers of stitches. The sample is sent to the lab to check that the skin cancer was fully removed. Return one week later to have wound  checked and surface stitches removed. Surgical wound leaves a line scar. Approximately 95% cure rate. Risk of recurrence, bleeding, infection, pain, injury to nearby structures, hypertrophic scar.

## 2024-02-29 NOTE — Telephone Encounter (Signed)
 Advised pt of bx results.  Scheduled patient for excision and for suture removal/TBSE.

## 2024-03-27 ENCOUNTER — Encounter: Admitting: Dermatology

## 2024-04-08 ENCOUNTER — Ambulatory Visit: Admitting: Dermatology

## 2024-12-18 ENCOUNTER — Ambulatory Visit: Admitting: Urology

## 2025-02-05 ENCOUNTER — Ambulatory Visit

## 2025-02-06 ENCOUNTER — Encounter
